# Patient Record
Sex: Male | Born: 1975 | Hispanic: No | Marital: Married | Smoking: Former smoker
Health system: Southern US, Community
[De-identification: ages and names within clinical notes are randomized; demographics above are authoritative.]

## PROBLEM LIST (undated history)

## (undated) DIAGNOSIS — I10 Essential (primary) hypertension: Secondary | ICD-10-CM

## (undated) DIAGNOSIS — A90 Dengue fever [classical dengue]: Secondary | ICD-10-CM

## (undated) HISTORY — DX: Essential (primary) hypertension: I10

## (undated) HISTORY — PX: IMPACTED THIRD MOLAR REMOVAL: SHX1790

---

## 2016-05-22 ENCOUNTER — Emergency Department
Admission: EM | Admit: 2016-05-22 | Discharge: 2016-05-22 | Disposition: A | Discharge status: Home / Self Care | Payer: Medicaid Other | Attending: Emergency Medicine | Admitting: Emergency Medicine

## 2016-05-22 ENCOUNTER — Emergency Department: Payer: Medicaid Other

## 2016-05-22 ENCOUNTER — Encounter: Payer: Self-pay | Admitting: Emergency Medicine

## 2016-05-22 DIAGNOSIS — Z87891 Personal history of nicotine dependence: Secondary | ICD-10-CM | POA: Diagnosis not present

## 2016-05-22 DIAGNOSIS — H538 Other visual disturbances: Secondary | ICD-10-CM | POA: Insufficient documentation

## 2016-05-22 DIAGNOSIS — R791 Abnormal coagulation profile: Secondary | ICD-10-CM | POA: Diagnosis not present

## 2016-05-22 DIAGNOSIS — R42 Dizziness and giddiness: Secondary | ICD-10-CM | POA: Insufficient documentation

## 2016-05-22 HISTORY — DX: Dengue fever (classical dengue): A90

## 2016-05-22 LAB — CBC
HCT: 41.5 % (ref 40.0–52.0)
HEMOGLOBIN: 14.4 g/dL (ref 13.0–18.0)
MCH: 28.8 pg (ref 26.0–34.0)
MCHC: 34.6 g/dL (ref 32.0–36.0)
MCV: 83.2 fL (ref 80.0–100.0)
Platelets: 231 10*3/uL (ref 150–440)
RBC: 4.99 MIL/uL (ref 4.40–5.90)
RDW: 13.5 % (ref 11.5–14.5)
WBC: 4.9 10*3/uL (ref 3.8–10.6)

## 2016-05-22 LAB — PROTIME-INR
INR: 0.96
Prothrombin Time: 12.8 seconds (ref 11.4–15.2)

## 2016-05-22 LAB — DIFFERENTIAL
BASOS ABS: 0.1 10*3/uL (ref 0–0.1)
Basophils Relative: 1 %
Eosinophils Absolute: 0.1 10*3/uL (ref 0–0.7)
Eosinophils Relative: 3 %
LYMPHS ABS: 1.9 10*3/uL (ref 1.0–3.6)
LYMPHS PCT: 39 %
Monocytes Absolute: 0.4 10*3/uL (ref 0.2–1.0)
Monocytes Relative: 9 %
NEUTROS PCT: 48 %
Neutro Abs: 2.4 10*3/uL (ref 1.4–6.5)

## 2016-05-22 LAB — COMPREHENSIVE METABOLIC PANEL
ALK PHOS: 87 U/L (ref 38–126)
ALT: 71 U/L — AB (ref 17–63)
AST: 53 U/L — AB (ref 15–41)
Albumin: 4.3 g/dL (ref 3.5–5.0)
Anion gap: 8 (ref 5–15)
BILIRUBIN TOTAL: 1.3 mg/dL — AB (ref 0.3–1.2)
BUN: 10 mg/dL (ref 6–20)
CALCIUM: 9.3 mg/dL (ref 8.9–10.3)
CO2: 24 mmol/L (ref 22–32)
Chloride: 107 mmol/L (ref 101–111)
Creatinine, Ser: 0.87 mg/dL (ref 0.61–1.24)
GFR calc Af Amer: >60 mL/min (ref >60)
GFR calc non Af Amer: >60 mL/min (ref >60)
GLUCOSE: 131 mg/dL — AB (ref 65–99)
Potassium: 3.7 mmol/L (ref 3.5–5.1)
Sodium: 139 mmol/L (ref 135–145)
TOTAL PROTEIN: 7.7 g/dL (ref 6.5–8.1)

## 2016-05-22 LAB — TROPONIN I: Troponin I: <0.03 ng/mL (ref <0.03)

## 2016-05-22 LAB — APTT: APTT: 31 s (ref 24–36)

## 2016-05-22 MED ORDER — MECLIZINE HCL 25 MG PO TABS
25.0000 mg | ORAL_TABLET | Freq: Once | ORAL | Status: AC
  Administered 2016-05-22: 25 mg via ORAL
  Filled 2016-05-22: qty 1

## 2016-05-22 MED ORDER — MECLIZINE HCL 25 MG PO TABS: 25.0000 mg | ORAL_TABLET | Freq: Three times a day (TID) | ORAL | 0 refills | Status: DC | PRN

## 2016-05-22 NOTE — ED Notes (Addendum)
Pt states vision in L eye is better, more clear. States he can see this Charity fundraiserN.   Pr provided apple juice.   Dr. Lenard LancePaduchowski at bedside.

## 2016-05-22 NOTE — ED Notes (Signed)
Unable to perform neuro check at this time d/t patient being in MRI.

## 2016-05-22 NOTE — ED Notes (Signed)
Pt stated he felt ok to walk to lobby to wait for family. Pt ambulated with steady gait to lobby.

## 2016-05-22 NOTE — ED Provider Notes (Signed)
[facility] Emergency Department Provider Note  Time seen: 8:47 AM  I have reviewed the triage vital signs and the nursing notes.   HISTORY  Chief Complaint Code Stroke    HPI Robert Smith is a 41 y.o. male with no past medical history who presents the emergency department with sudden onset of blurred vision from the left eye along with dizziness. According to the patient he was driving to work and at 8:46 this morning acutely became dizzy as if the room was spinning around him. He also states some blurred vision in the left eye. Denies any headache, confusion or slurred speech. Denies any weakness or numbness of any arm or leg. Denies any recent fever or cough congestion and patient states the vision has largely returned to normal but he continues to feel dizzy which she describes as a room spinning sensation. No history of vertigo in the past.  No past medical history on file.  There are no active problems to display for this patient.   No past surgical history on file.  Prior to Admission medications   Not on File    Allergies not on file  No family history on file.  Social History Social History  Substance Use Topics  . Smoking status: Not on file  . Smokeless tobacco: Not on file  . Alcohol use Not on file    Review of Systems Constitutional: Negative for fever. Cardiovascular: Negative for chest pain. Respiratory: Negative for shortness of breath. Gastrointestinal: Negative for abdominal pain Neurological: Negative for headache. Denies focal weakness or numbness. 10-point ROS otherwise negative.  ____________________________________________   PHYSICAL EXAM:  VITAL SIGNS: ED Triage Vitals  Enc Vitals Group     BP 05/22/16 0833 (!) 149/97     Pulse Rate 05/22/16 0833 85     Resp 05/22/16 0833 18     Temp 05/22/16 0833 97.9 F (36.6 C)     Temp Source 05/22/16 0833 Oral     SpO2 05/22/16 0833 96 %     Weight 05/22/16 0834 210 lb  (95.3 kg)     Height 05/22/16 0834 5\' 8"  (1.727 m)     Head Circumference --      Peak Flow --      Pain Score --      Pain Loc --      Pain Edu? --      Excl. in GC? --     Constitutional: Alert and oriented. Well appearing and in no distress. Eyes: Normal exam, 2-3 mm PERRL. ENT   Head: Normocephalic and atraumatic. Normal tympanic membranes.   Mouth/Throat: Mucous membranes are moist. Cardiovascular: Normal rate, regular rhythm. No murmur Respiratory: Normal respiratory effort without tachypnea nor retractions. Breath sounds are clear Gastrointestinal: Soft and nontender. No distention.   Musculoskeletal: Nontender with normal range of motion in all extremities.  Neurologic:  Normal speech and language. No gross focal neurologic deficits. No pronator drift. 5/5 motor in all extremities. No lower extremity drift. Intact finger to nose testing. Cranial nerves intact. Skin:  Skin is warm, dry and intact.  Psychiatric: Mood and affect are normal.   ____________________________________________    EKG  EKG reviewed and interpreted by the social is normal sinus rhythm at 83 bpm. Narrow QRS, normal axis, normal intervals, no ST changes. Normal EKG.  ____________________________________________    RADIOLOGY  CT head negative  ____________________________________________   INITIAL IMPRESSION / ASSESSMENT AND PLAN / ED COURSE  Pertinent labs & imaging results  that were available during my care of the patient were reviewed by me and considered in my medical decision making (see chart for details).  The patient presents the emergency department with sudden onset of dizziness and blurred vision from the left eye occurring at 7:30 this morning. Describes the dizziness as a room spinning sensation which continues currently. Patient's exam is normal, no deficits identified. Intact cerebellar testing. Patient has an aspirin allergy. CT head is negative. Code stroke protocols were  initiated by triage. Neurology is currently speaking to the patient. Overall his symptoms appear to be more suggestive of vertigo than that of CVA.   Patient has been seen by neurology. They state the patient's exam is most consistent with vertigo however given the complaints of blurred vision they believe the patient would benefit from MRI to rule out CVA. If MRI is negative the patient can be safely discharged home. In the meantime we will treat the patient with meclizine and monitor for symptom improvement.  MRI of the brain is normal. Patient's symptoms are improved. We will discharge with meclizine and PCP follow-up. I discussed strict return precautions for any focal weakness numbness, headache, confusion or slurred speech.    FINAL CLINICAL IMPRESSION(S) / ED DIAGNOSES  Dizziness Vertigo   Minna AntisKevin Camron Essman, MD 05/22/16 1137

## 2016-05-22 NOTE — ED Notes (Signed)
Patient states he was driving to work this am and left eye became blurry. States he had to sit down while he was trying to work d/t feeling dizzy "like I couldn't stay still". Patient with continued dizziness.

## 2016-05-22 NOTE — Discharge Instructions (Signed)
You have been seen in the emergency department for dizziness. Your workup including MRI of her brain is normal. [name] take her prescribed meclizine as needed for dizziness, as written. Please follow-up with a primary care physician in the next 2-3 days for recheck/reevaluation. Return to the emergency department for any worsening dizziness, headache, fever, slurred speech, weakness or numbness of any arm or leg.

## 2016-05-22 NOTE — ED Triage Notes (Addendum)
Pt with left eye blurred vision and extreme dizziness 1 hour PTA. No obvious facial droop or weakness, Pt did have difficulty getting in wheelchair.

## 2016-05-22 NOTE — ED Notes (Signed)
Pt returned from MRI via stretcher.

## 2017-02-16 IMAGING — CT CT HEAD CODE STROKE
3 series · 15 of 47 positions shown, 18 images · non-contrast
Comparison: None.

CLINICAL DATA: Code stroke. Blurred vision in the left eye and
extreme dizziness.

EXAM:
CT HEAD WITHOUT CONTRAST
TECHNIQUE: Contiguous axial images were obtained from the base of the skull
through the vertex without intravenous contrast.

[Series 2: head wo · axial · 0.46mm/px · z∈[-101,+34]mm · 9 of 33 slices shown, 12 images]
[im 3/33  brain]
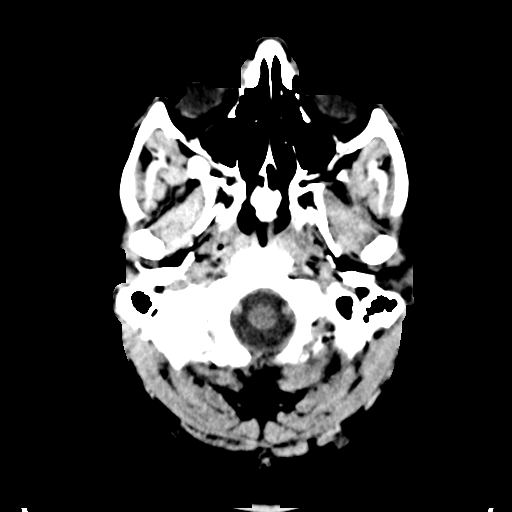
[im 3/33  bone]
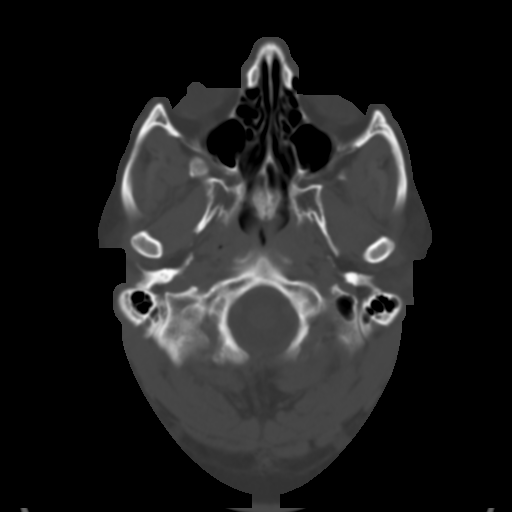
[im 6/33  brain]
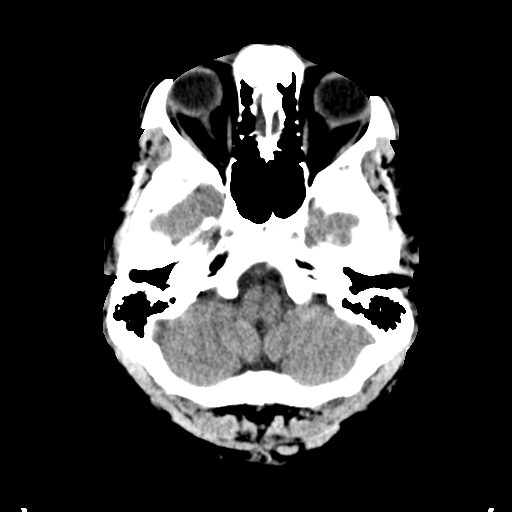
[im 9/33  brain]
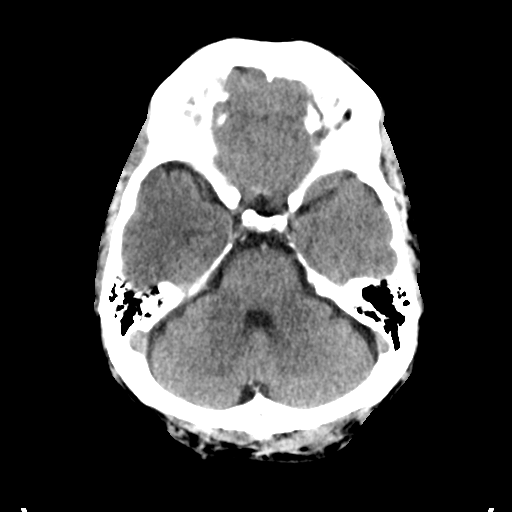
[im 13/33  brain]
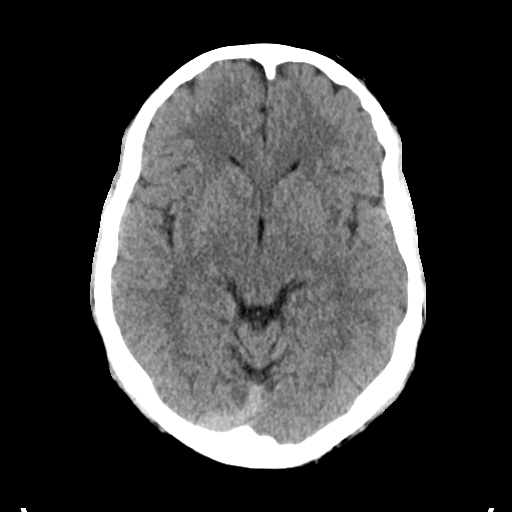
[im 17/33  brain]
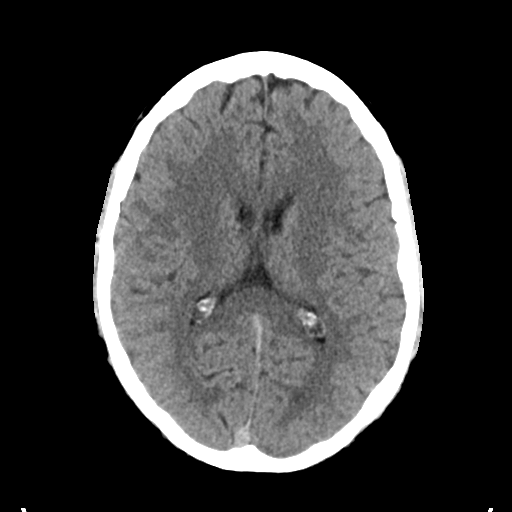
[im 17/33  bone]
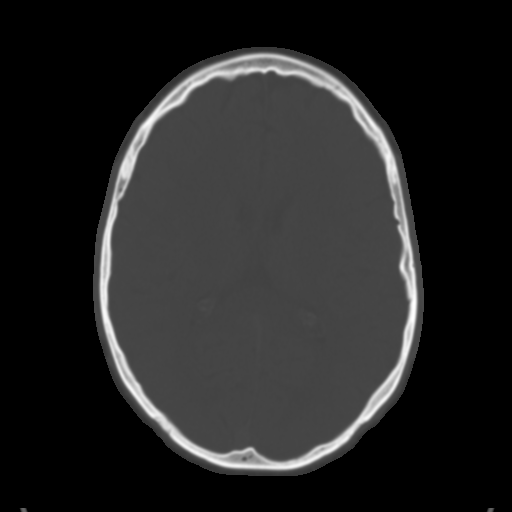
[im 20/33  brain]
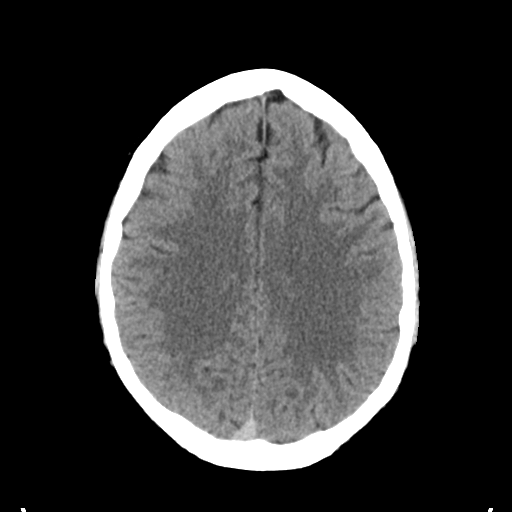
[im 24/33  brain]
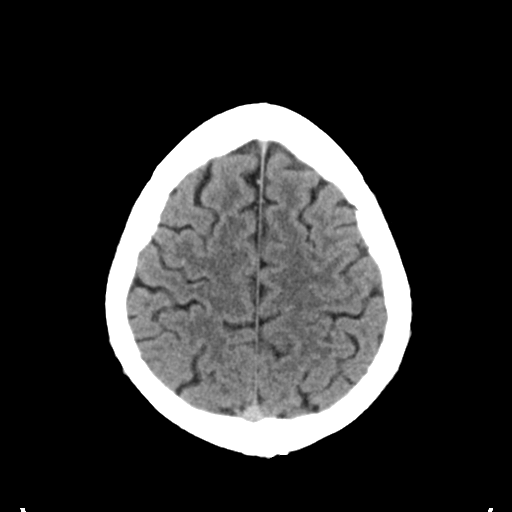
[im 27/33  brain]
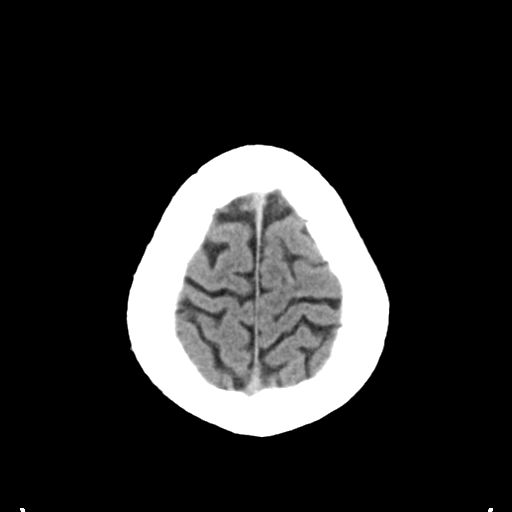
[im 30/33  brain]
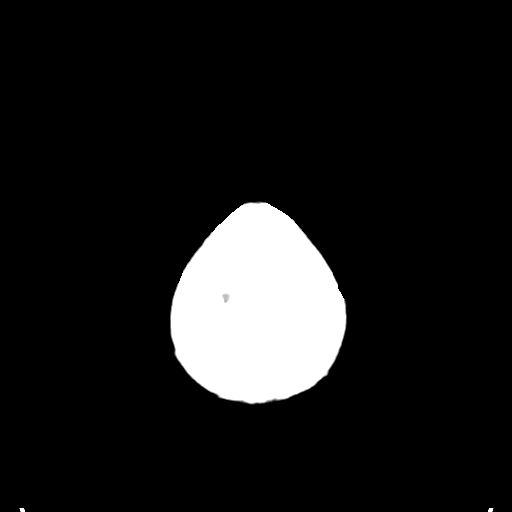
[im 30/33  bone]
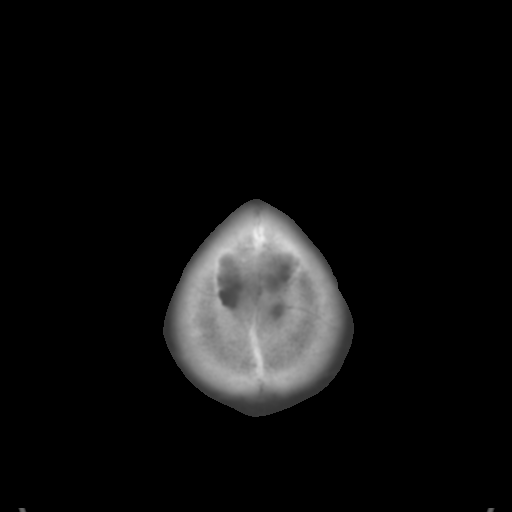

[Series 4: coronal soft tissue · coronal · 0.34mm/px · 3 of 72 slices shown]
[im 24/72  brain]
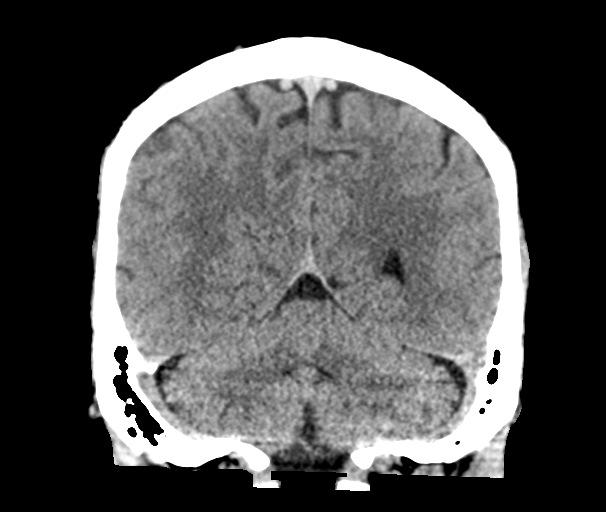
[im 32/72  brain]
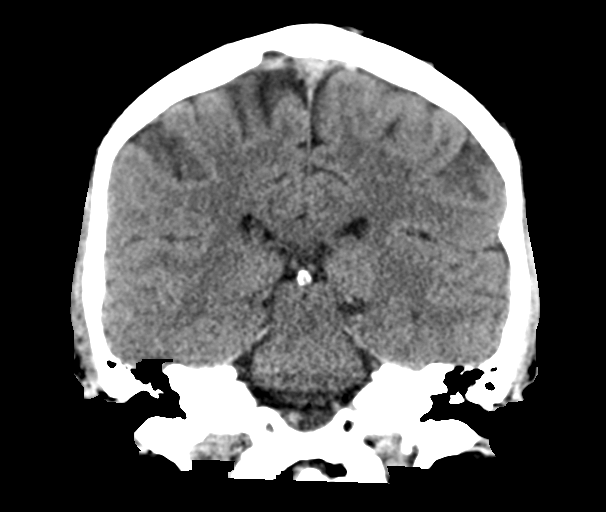
[im 40/72  brain]
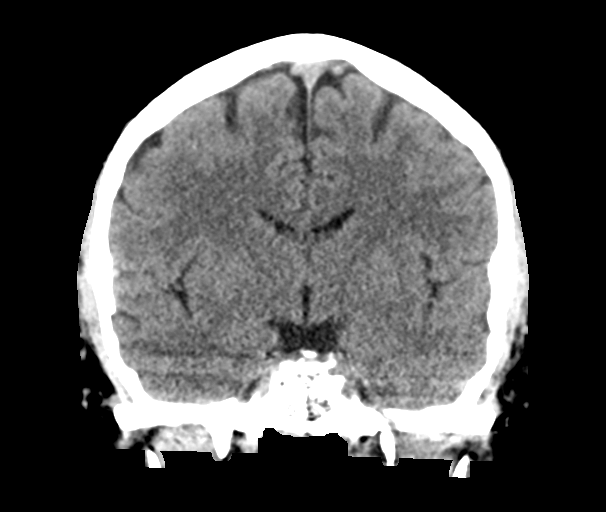

[Series 5: sagittal soft tissue · sagittal · 0.37mm/px · 3 of 59 slices shown]
[im 20/59  brain]
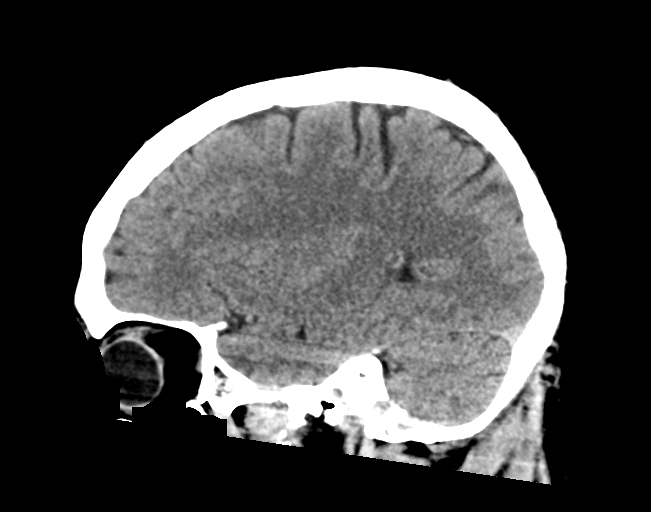
[im 30/59  brain]
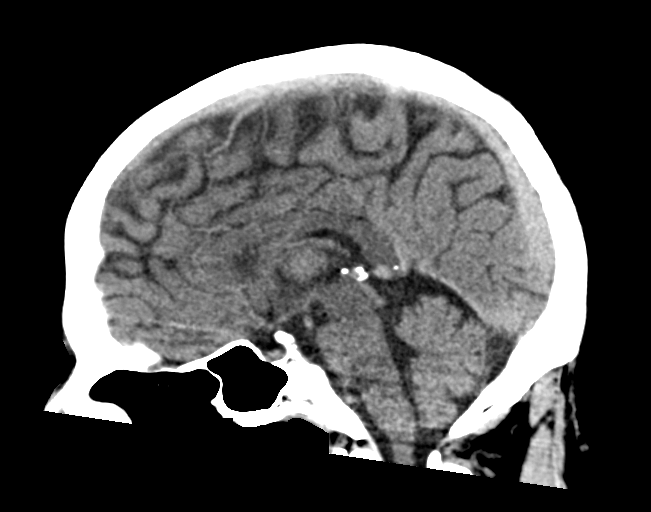
[im 39/59  brain]
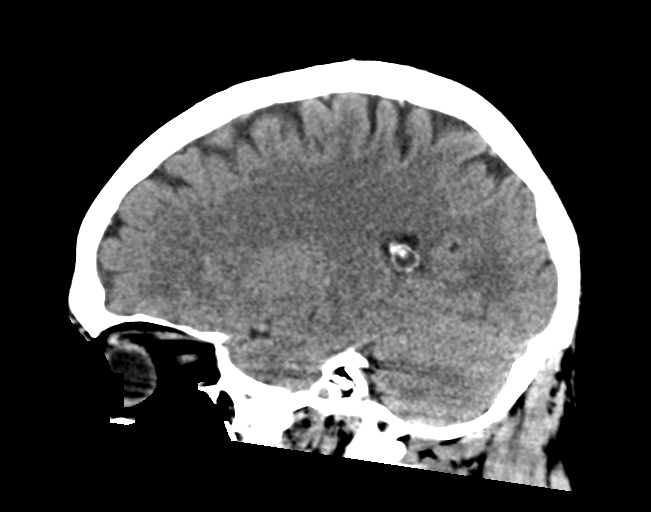

[15 of 47 positions shown; findings below may reference images not displayed]

FINDINGS: Brain: No evidence of acute infarction, hemorrhage, hydrocephalus,
extra-axial collection or mass lesion/mass effect.

Vascular: No hyperdense vessel or unexpected calcification.

Skull: Normal. Negative for fracture or focal lesion.

Sinuses/Orbits: Negative

Other: These results were called by telephone at the time of
interpretation on 05/22/2016 at [DATE] to Dr. Roshan , who
verbally acknowledged these results.

ASPECTS (Alberta Stroke Program Early CT Score)

- Ganglionic level infarction (caudate, lentiform nuclei, internal
capsule, insula, M1-M3 cortex): 7

- Supraganglionic infarction (M4-M6 cortex): 3

Total score (0-10 with 10 being normal): 10
IMPRESSION: Negative head CT. ASPECTS is 10.

## 2017-06-29 ENCOUNTER — Ambulatory Visit: Payer: Self-pay

## 2017-07-06 ENCOUNTER — Ambulatory Visit: Payer: Self-pay

## 2017-07-13 ENCOUNTER — Encounter: Payer: Self-pay | Admitting: Urology

## 2017-07-13 ENCOUNTER — Ambulatory Visit (INDEPENDENT_AMBULATORY_CARE_PROVIDER_SITE_OTHER): Payer: Medicaid Other | Admitting: Urology

## 2017-07-13 DIAGNOSIS — Z3009 Encounter for other general counseling and advice on contraception: Secondary | ICD-10-CM | POA: Diagnosis not present

## 2017-07-13 MED ORDER — DIAZEPAM 10 MG PO TABS: 10.0000 mg | ORAL_TABLET | Freq: Once | ORAL | 0 refills | Status: AC

## 2017-07-13 NOTE — Progress Notes (Signed)
07/13/2017 1:38 PM   Robert Smith 1975/12/29 [id number]  Referring provider: No referring provider defined for this encounter.  Chief Complaint  Patient presents with  . VAS Consult    HPI: The patient is a 42 year old gentleman who presents today to discuss a vasectomy.  He has 2 children and desires no more.  He is currently married.  He has no genitourinary history.  He denies previous genitourinary surgeries.  He denies issues with urination.  He has no history of gross hematuria or nephrolithiasis.   PMH: Past Medical History:  Diagnosis Date  . Dengue fever     Surgical History: History reviewed. No pertinent surgical history.  Home Medications:  Allergies as of 07/13/2017      Reactions   Aspirin Other (See Comments)   "it thins my blood and I don't have much white cells"   Penicillins    Has patient had a PCN reaction causing immediate rash, facial/tongue/throat swelling, SOB or lightheadedness with hypotension: yes Has patient had a PCN reaction causing severe rash involving mucus membranes or skin necrosis: no Has patient had a PCN reaction that required hospitalization no Has patient had a PCN reaction occurring within the last 10 years: no If all of the above answers are "NO", then may proceed with Cephalosporin use. Causes dizziness and weakness       Medication List        Accurate as of 07/13/17  1:38 PM. Always use your most recent med list.          diazepam 10 MG tablet Commonly known as:  VALIUM Take 1 tablet (10 mg total) by mouth once for 1 dose.   meclizine 25 MG tablet Commonly known as:  ANTIVERT Take 1 tablet (25 mg total) by mouth 3 (three) times daily as needed for dizziness.       Allergies:  Allergies  Allergen Reactions  . Aspirin Other (See Comments)    "it thins my blood and I don't have much white cells"  . Penicillins     Has patient had a PCN reaction causing immediate rash, facial/tongue/throat swelling, SOB or  lightheadedness with hypotension: yes Has patient had a PCN reaction causing severe rash involving mucus membranes or skin necrosis: no Has patient had a PCN reaction that required hospitalization no Has patient had a PCN reaction occurring within the last 10 years: no If all of the above answers are "NO", then may proceed with Cephalosporin use.  Causes dizziness and weakness      Family History: Family History  Problem Relation Age of Onset  . Prostate cancer Maternal Grandfather   . Sickle cell trait Son   . Bladder Cancer Neg Hx   . Kidney cancer Neg Hx     Social History:  reports that he has quit smoking. He has never used smokeless tobacco. He reports that he does not drink alcohol or use drugs.  ROS: UROLOGY Frequent Urination?: No Hard to postpone urination?: No Burning/pain with urination?: No Get up at night to urinate?: No Leakage of urine?: No Urine stream starts and stops?: No Trouble starting stream?: No Do you have to strain to urinate?: No Blood in urine?: No Urinary tract infection?: No Sexually transmitted disease?: No Injury to kidneys or bladder?: No Painful intercourse?: No Weak stream?: No Erection problems?: No Penile pain?: No  Gastrointestinal Nausea?: No Vomiting?: No Indigestion/heartburn?: No Diarrhea?: No Constipation?: No  Constitutional Fever: No Night sweats?: No Weight loss?: No Fatigue?: No  Skin Skin rash/lesions?: No Itching?: No  Eyes Blurred vision?: No Double vision?: No  Ears/Nose/Throat Sore throat?: No Sinus problems?: No  Hematologic/Lymphatic Swollen glands?: No Easy bruising?: No  Cardiovascular Leg swelling?: No Chest pain?: No  Respiratory Cough?: No Shortness of breath?: No  Endocrine Excessive thirst?: No  Musculoskeletal Back pain?: No Joint pain?: No  Neurological Headaches?: No Dizziness?: No  Psychologic Depression?: No Anxiety?: No  Physical Exam: There were no vitals  taken for this visit.  Constitutional:  Alert and oriented, No acute distress. HEENT: [place] AT, moist mucus membranes.  Trachea midline, no masses. Cardiovascular: No clubbing, cyanosis, or edema. Respiratory: Normal respiratory effort, no increased work of breathing. GI: Abdomen is soft, nontender, nondistended, no abdominal masses GU: No CVA tenderness.  Normal phallus.  Left vas easily palpable.  Right vas a little more difficult to palpate. Skin: No rashes, bruises or suspicious lesions. Lymph: No cervical or inguinal adenopathy. Neurologic: Grossly intact, no focal deficits, moving all 4 extremities. Psychiatric: Normal mood and affect.  Laboratory Data: Lab Results  Component Value Date   WBC 4.9 05/22/2016   HGB 14.4 05/22/2016   HCT 41.5 05/22/2016   MCV 83.2 05/22/2016   PLT 231 05/22/2016    Lab Results  Component Value Date   CREATININE 0.87 05/22/2016    No results found for: PSA  No results found for: TESTOSTERONE  No results found for: HGBA1C  Urinalysis No results found for: COLORURINE, APPEARANCEUR, LABSPEC, PHURINE, GLUCOSEU, HGBUR, BILIRUBINUR, KETONESUR, PROTEINUR, UROBILINOGEN, NITRITE, LEUKOCYTESUR   Assessment & Plan:    Today, we discussed what the vas deferens is, where it is located, and its function. We reviewed the procedure for vasectomy, it's risks, benefits, alternatives, and likelihood of achieving his goals. We discussed in detail the procedure, complications, and recovery as well as the need for clearance prior to unprotected intercourse. We discussed that vasectomy does not protect against sexually transmitted diseases. We discussed that this procedure does not result in immediate sterility and that they would need to use other forms of birth control until he has been cleared with negative postvasectomy semen analyses. I explained that the procedure is considered to be permanent and that attempts at reversal have varying degrees of success. These  options include vasectomy reversal, sperm retrieval, and in vitro fertilization; these can be very expensive. We discussed the chance of postvasectomy pain syndrome which occurs in less than 5% of patients. I explained to the patient that there is no treatment to resolve this chronic pain, and that if it developed I would not be able to help resolve the issue, but that surgery is generally not needed for correction. I explained there have even been reports of systemic like illness associated with this chronic pain, and that there was no good cure. I explained that vasectomy it is not a 100% reliable form of birth control, and the risk of pregnancy after vasectomy is approximately 1 in 2000 men who had a negative postvasectomy semen analysis or rare non-motile sperm. I explained that repeat vasectomy was necessary in less than 1% of vasectomy procedures when employing the type of technique that I use. I explained that he should refrain from ejaculation for approximately one week following vasectomy. I explained that there are other options for birth control which are permanent and non-permanent; we discussed these. I explained the rates of surgical complications, such as symptomatic hematoma or infection, are low (1-2%) and vary with the surgeon's experience and criteria used to diagnose  the complication.  The patient had the opportunity to ask questions to his stated satisfaction. He voiced understanding of the above factors and stated that he has read all the information provided to him and the packets and informed consent  1. Family planning -vasectomy  Return for vasectomy.  Hildred Laser, MD  [facility] [address] [place], [place] [address] [phone]

## 2017-08-17 ENCOUNTER — Encounter: Payer: Self-pay | Admitting: Urology

## 2017-08-17 ENCOUNTER — Ambulatory Visit (INDEPENDENT_AMBULATORY_CARE_PROVIDER_SITE_OTHER): Payer: Medicaid Other | Admitting: Urology

## 2017-08-17 VITALS — BP 130/84 | HR 84 | Resp 16 | Ht 69.0 in | Wt 209.4 lb

## 2017-08-17 DIAGNOSIS — Z302 Encounter for sterilization: Secondary | ICD-10-CM | POA: Diagnosis not present

## 2017-08-17 HISTORY — PX: VASECTOMY: SHX75

## 2017-08-17 MED ORDER — HYDROCODONE-ACETAMINOPHEN 5-325 MG PO TABS: 1.0000 | ORAL_TABLET | ORAL | 0 refills | Status: DC | PRN

## 2017-08-17 NOTE — Progress Notes (Signed)
Vasectomy Procedure Note  Indications: The patient is a 42 y.o. male who presents today for elective sterilization.  He has been consented for the procedure.  He is aware of the risks and benefits.  He had no additional questions.  He agrees to proceed.  He denies any other significant change since his last visit.  Pre-operative Diagnosis: Elective sterilization  Post-operative Diagnosis: Elective sterilization  Premedication: Valium 10 mg po  Surgeon: Lorin PicketScott C. Nicoli Nardozzi, M.D  Description: The patient was prepped and draped in the standard fashion.  The right vas deferens was identified and brought superiorly to the anterior scrotal skin.  The skin and vas was then anesthetized utilizing 4 ml 1% lidocaine.  A small stab incision was made and spread with the vas dissector.  The vas was grasped utilizing the vas clamp and elevated out of the incision.  The vas was dissected free from surrounding tissue and vessels and an ~1 cm segment was excised.  The vas lumens were cauterized utilizing electrocautery.  The distal segment was buried in the surrounding sheath with a 3-0 chromic suture.  No significant bleeding was observed.  The vas ends were then dropped back into the hemiscrotum.  The skin was closed with hemostatic pressure.  An identical procedure was performed on the contralateral side.  Clean dry gauze was applied to the incision sites.  The patient tolerated the procedure well.  Complications:None  Recommendations: 1.  No lifting greater than 10 pounds or strenuousactivity for 1 week. 2.  Scrotal support for 1 week. 3.  Shower only for 1 week; may shower in the morning 4.  May resume intercourse in one week if no significant discomfort.  Continue alternate contraception for 12 weeks.  5.  Call for significant pain, swelling, redness, drainage or fever greater than 100.5. 6.  Rx hydrocodone/APAP 5/325 1-2 every 6 hours as needed for pain. 7.  Follow-up semen analyses 6 and 12 weeks.

## 2017-08-21 ENCOUNTER — Encounter: Payer: Self-pay | Admitting: Urology

## 2017-08-22 ENCOUNTER — Encounter: Payer: Self-pay | Admitting: Radiology

## 2017-09-20 ENCOUNTER — Ambulatory Visit
Admission: RE | Admit: 2017-09-20 | Discharge: 2017-09-20 | Disposition: A | Discharge status: Home / Self Care | Payer: Managed Care, Other (non HMO) | Source: Ambulatory Visit | Attending: Family Medicine | Admitting: Family Medicine

## 2017-09-20 ENCOUNTER — Ambulatory Visit: Payer: Medicaid Other | Admitting: Family Medicine

## 2017-09-20 VITALS — BP 136/88 | HR 94 | Temp 98.0°F | Resp 20

## 2017-09-20 DIAGNOSIS — R300 Dysuria: Secondary | ICD-10-CM | POA: Diagnosis present

## 2017-09-20 DIAGNOSIS — J029 Acute pharyngitis, unspecified: Secondary | ICD-10-CM

## 2017-09-20 DIAGNOSIS — R1030 Lower abdominal pain, unspecified: Secondary | ICD-10-CM

## 2017-09-20 DIAGNOSIS — M545 Low back pain: Secondary | ICD-10-CM

## 2017-09-20 DIAGNOSIS — M62830 Muscle spasm of back: Secondary | ICD-10-CM

## 2017-09-20 DIAGNOSIS — J069 Acute upper respiratory infection, unspecified: Secondary | ICD-10-CM

## 2017-09-20 DIAGNOSIS — R350 Frequency of micturition: Secondary | ICD-10-CM | POA: Diagnosis not present

## 2017-09-20 LAB — POCT URINALYSIS DIPSTICK
BILIRUBIN UA: NEGATIVE
GLUCOSE UA: NEGATIVE
KETONES UA: NEGATIVE
Leukocytes, UA: NEGATIVE
Nitrite, UA: NEGATIVE
Protein, UA: NEGATIVE
RBC UA: NEGATIVE
SPEC GRAV UA: 1.02 (ref 1.010–1.025)
Urobilinogen, UA: 0.2 E.U./dL
pH, UA: 5.5 (ref 5.0–8.0)

## 2017-09-20 LAB — POCT RAPID STREP A (OFFICE): Rapid Strep A Screen: NEGATIVE

## 2017-09-20 MED ORDER — CYCLOBENZAPRINE HCL 5 MG PO TABS: 5.0000 mg | ORAL_TABLET | Freq: Every day | ORAL | 0 refills | Status: DC

## 2017-09-20 NOTE — Progress Notes (Addendum)
Subjective: multiple complaints     Robert Smith is a 42 y.o. male who presents for evaluation of sore throat, productive cough, low back pain, dysuria, fever, urinary frequency, and chills.  Patient unsure of duration of fever and chills.  Reports fever at home was "110" two days ago.  Patient reports sore throat and a productive cough with green sputum for 2 days.  Denies any other upper respiratory symptoms.  Denies shortness of breath, wheezing, or chest or back pain.  Reports his symptoms are mild and improving. Denies rash, ear pain, difficulty swallowing, confusion, nasal congestion, purulent nasal discharge, anosmia/hyposmia, dental pain, facial pressure/unilateral facial pain, pain exacerbated by bending over, headache, body aches, fatigue, severe symptoms, or initial improvement and then worsening of symptoms.  Patient had a vasectomy on 08/17/2017 and reports that approximately 1 week later he began to notice urinary frequency, dysuria, hematuria, nausea, loss of appetite, and bilateral lower back pain.  Patient has not contacted his urologist regarding this.  Patient denies any previous history of UTI, pyelonephritis, nephrolithiasis, prostate issues, renal insufficiency, or any other relevant history.  Patient denies any personal history of prostate cancer and his only family history of prostate cancer was his paternal grandfather, who was diagnosed at the age of 3. Denies vomiting, diarrhea, abdominal pain, arthralagias, constipation, headache, hematochezia, melena, myalgias or sweats.   Patient denies any risk factors or concern for STDs.  Patient declined testing for STDs.  Patient denies any genital rashes, lesions, or penile discharge.  Denies any penile or scrotal swelling, pain, erythema, warmth to the touch, or any abnormalities.  Patient reports the back pain is worse with flexion and side-to-side motion of his back.  Patient reports he has been lifting weights more recently but denies any  injury which he attributes to the onset of his back pain. The pain is located across the lower back and does not radiate. The pain is described as soreness and occurs all day and night.  He has no other symptoms associated with the back pain. Previous history of symptoms: never. History of cancer: negative. The patient has no "red flag" history indicative of complicated back pain.  Patient denies previous injury, trauma or surgeries to his  back.  Denies saddle anesthesia, urinary/bowel retention, or any neurologic symptoms.   Treatment to date: stretching and exercises.  Medical history: Patient denies any other than his recent vasectomy.   Review of Systems Pertinent items noted in HPI and remainder of comprehensive ROS otherwise negative.     Objective:   Physical Exam General: Awake, alert, and oriented. No acute distress. Well developed, hydrated and nourished. Appears stated age. Nontoxic appearance.  HEENT: No PND noted.  Mild erythema to posterior oropharynx.  No edema or exudates of pharynx or tonsils. No erythema or bulging of TM.  No erythema/edema to nasal mucosa. Sinuses nontender. Supple neck without adenopathy. Cardiac: Heart rate and rhythm are normal. No murmurs, gallops, or rubs are auscultated. S1 and S2 are heard and are of normal intensity.  Respiratory: No signs of respiratory distress. Lungs clear. No tachypnea. Able to speak in full sentences without dyspnea. Nonlabored respirations.  Skin: Skin is warm, dry and intact. Appropriate color for ethnicity. No cyanosis noted.  Normal turgor.  No lesions or rashes. Back: Full range of motion.  Patient endorses pain with flexion and side-to-side motion of his low back.  Lumbar paraspinal muscle tenderness bilaterally with muscle spasm more pronounced to left lower back.  Patient is most tender  in the left lumbar paraspinal muscles.  No midline tenderness or deformity.  Normal curvature and alignment. Normal reflexes, gait, strength  and negative straight-leg raise.  No CVA tenderness noted. Neurological: normal DTRs, muscle strength and reflexes. Abdomen:  Soft, no masses, no organomegaly, normal bowel sounds.  Generalized bilateral lower abdominal tenderness.  No guarding. No rebound tenderness. Negative Rovsing sign.  Negative psoas and obturator sign.  Patient reports that the pain is equal on both sides of his abdomen.  No abdominal rigidity.  Tenderness is not localized to McBurney's point.  Negative Markel sign. Extremities: extremities normal, atraumatic, no cyanosis or edema Pulses: 2+ and symmetric  Diagnostic Results:  Rapid strep swab: Negative Urine dipstick: Negative for all components.  EXAM: RENAL / URINARY TRACT ULTRASOUND COMPLETE  COMPARISON:  None.  FINDINGS: Right Kidney:  Length: 12.0 cm. Echogenicity within normal limits. No mass or hydronephrosis visualized.  Left Kidney:  Length: 11.5 cm. Echogenicity within normal limits. No mass or hydronephrosis visualized. 1 cm lower pole cyst.  Bladder:  Appears normal for degree of bladder distention. Normal ureteral jets.  IMPRESSION: Normal urinary tract ultrasound. No sonographic evidence of obstruction or advanced infection.  These results will be called to the ordering clinician or representative by the Radiologist Assistant, and communication documented in the PACS or zVision Dashboard.   Electronically Signed   By: Paulina Fusi M.D.   On: 09/20/2017 17:25   Assessment:   Viral upper respiratory illness  Low back pain  Abdominal tenderness, non specific  Dysuria  Plan:   1. URI  Discussed diagnosis and treatment of URI. Suggested symptomatic OTC remedies. Recommended rest and fluids. Advised patient to monitor for fever at home and report if this recurs to our clinic and to his urologist.  2.  Low back pain Proper lifting, bending technique discussed. Stretching exercises discussed. Regular aerobic and  trunk strengthening exercises discussed. Short (2-4 day) period of relative rest recommended until acute symptoms improve. Heat to affected area as needed for local pain relief. Provided patient with ThermaCare heat wrap and recommended applying heat 2-3 times a day. Provided patient with a prescription for Flexeril to use as needed at night.  Discussed side/adverse effects and advised him against driving, operating machinery, or combining with other CNS depressants such as alcohol.  3. Abdominal tenderness and urinary symptoms  CBC, CMP, PSA, and urine culture pending. Advised patient to call his urologist office immediately upon leaving to schedule a follow-up appointment for as soon as possible for further assessment of his symptoms. Attempting to call the patient multiple times to discuss his [place] and lab results but have been unable to reach him by phone.    The diagnosis was discussed with the patient and evaluation and treatment plans outlined. Further follow-up plans will be based on outcome of lab/imaging studies; see orders.  Discussed red flag symptoms and circumstances with which to seek medical care.   New Prescriptions   CYCLOBENZAPRINE (FLEXERIL) 5 MG TABLET    Take 1 tablet (5 mg total) by mouth at bedtime.    09/27/2017: Patient finally called the clinic back to discuss his recent results.  Discussed ultrasound results and advised patient follow-up with his urologist regarding these at his apt on 09/28/17.  Discussed his lab results and his elevated AST and ALT.  Patient reports infrequent Tylenol and alcohol use.  Advised patient that he needs to establish care with a new primary care provider within the next 2 weeks and follow-up with  his primary care provider regarding this for further evaluation.  Provided patient with local resources for primary care and encouraged him to call today to make an appointment.  Patient reports tolerating the Flexeril well and that his symptoms have  improved significantly.  Denies any new symptoms.  Patient has a appointment with his urologist scheduled for 09/28/17.  Red flag symptoms and indications to seek medical care discussed.

## 2017-09-21 LAB — CBC WITH DIFFERENTIAL/PLATELET
Basophils Absolute: 0 10*3/uL (ref 0.0–0.2)
Basos: 1 %
EOS (ABSOLUTE): 0.2 10*3/uL (ref 0.0–0.4)
EOS: 2 %
HEMATOCRIT: 41.9 % (ref 37.5–51.0)
HEMOGLOBIN: 14.2 g/dL (ref 13.0–17.7)
Immature Grans (Abs): 0 10*3/uL (ref 0.0–0.1)
Immature Granulocytes: 1 %
LYMPHS ABS: 2.4 10*3/uL (ref 0.7–3.1)
Lymphs: 37 %
MCH: 28.4 pg (ref 26.6–33.0)
MCHC: 33.9 g/dL (ref 31.5–35.7)
MCV: 84 fL (ref 79–97)
MONOCYTES: 8 %
Monocytes Absolute: 0.6 10*3/uL (ref 0.1–0.9)
NEUTROS ABS: 3.4 10*3/uL (ref 1.4–7.0)
Neutrophils: 51 %
Platelets: 271 10*3/uL (ref 150–450)
RBC: 5 x10E6/uL (ref 4.14–5.80)
RDW: 14.1 % (ref 12.3–15.4)
WBC: 6.6 10*3/uL (ref 3.4–10.8)

## 2017-09-21 LAB — COMPREHENSIVE METABOLIC PANEL
ALBUMIN: 4.6 g/dL (ref 3.5–5.5)
ALT: 80 IU/L — ABNORMAL HIGH (ref 0–44)
AST: 46 IU/L — ABNORMAL HIGH (ref 0–40)
Albumin/Globulin Ratio: 1.4 (ref 1.2–2.2)
Alkaline Phosphatase: 101 IU/L (ref 39–117)
BILIRUBIN TOTAL: 0.8 mg/dL (ref 0.0–1.2)
BUN / CREAT RATIO: 13 (ref 9–20)
BUN: 11 mg/dL (ref 6–24)
CO2: 24 mmol/L (ref 20–29)
CREATININE: 0.86 mg/dL (ref 0.76–1.27)
Calcium: 9.8 mg/dL (ref 8.7–10.2)
Chloride: 103 mmol/L (ref 96–106)
GFR calc non Af Amer: 108 mL/min/{1.73_m2} (ref >59)
GFR, EST AFRICAN AMERICAN: 125 mL/min/{1.73_m2} (ref >59)
GLOBULIN, TOTAL: 3.2 g/dL (ref 1.5–4.5)
GLUCOSE: 114 mg/dL — AB (ref 65–99)
Potassium: 4.3 mmol/L (ref 3.5–5.2)
Sodium: 142 mmol/L (ref 134–144)
TOTAL PROTEIN: 7.8 g/dL (ref 6.0–8.5)

## 2017-09-21 LAB — PSA: Prostate Specific Ag, Serum: 0.5 ng/mL (ref 0.0–4.0)

## 2017-09-21 NOTE — Progress Notes (Signed)
Attempted to call patient multiple times without any success to discuss his lab and [place] results. Will continue to try calling the patient.

## 2017-09-22 LAB — URINE CULTURE: ORGANISM ID, BACTERIA: NO GROWTH

## 2017-09-25 NOTE — Progress Notes (Signed)
Carollee HerterShannon, Will you mail this patient copies of his lab and ultrasound results?  Please include a note to call our clinic to discuss these.  I been trying to reach him by phone since last week and have been unsuccessful.

## 2017-09-25 NOTE — Progress Notes (Signed)
Attempted to call the patient multiple times additionally over the last few days without any success and left a voicemail for the patient to call the clinic back to discuss his ultrasound and lab results.

## 2017-09-26 NOTE — Progress Notes (Signed)
I tried calling him multiple times yesterday after four without any success again. Will you see if you can get in touch with him today and transfer the call to me if you do? If not, send him the labs in the mail with a note to call us. Thank you!

## 2017-09-28 ENCOUNTER — Other Ambulatory Visit: Payer: Medicaid Other

## 2017-09-28 ENCOUNTER — Other Ambulatory Visit: Payer: Self-pay | Admitting: Family Medicine

## 2017-09-28 DIAGNOSIS — Z9852 Vasectomy status: Secondary | ICD-10-CM

## 2017-09-29 LAB — POST-VAS SPERM EVALUATION,QUAL: SEMEN VOLUME: 3.6 mL

## 2017-10-01 ENCOUNTER — Telehealth: Payer: Self-pay

## 2017-10-01 NOTE — Telephone Encounter (Signed)
-----   Message from Riki AltesScott C Stoioff, MD sent at 10/01/2017  8:39 AM EDT ----- 6-week post vasectomy specimen show sperm present.  Continue alternate contraception until a 12-week specimen is examined.

## 2017-10-01 NOTE — Telephone Encounter (Signed)
Pt informed

## 2017-10-16 NOTE — Progress Notes (Signed)
10/17/2017 2:32 PM   Robert Smith July 20, 1975 [id number]  Referring provider: No referring provider defined for this encounter.  No chief complaint on file.   HPI: Patient is a 42 -year-old Hispanic male who presents today as a referral from Frances MaywoodKatherine M D'Jernes, FNP for recheck for urinary frequency.    One month ago, he woke up feeling sick.  He had fevers of 101 with all over body aches.  He also had a sore throat.  He had urinary frequency and nocturia as well.  RUS in 08/2017 was negative.  This lasted for a couple of days.    He does not have a prior history of recurrent urinary tract infections, nephrolithiasis, trauma to the genitourinary tract, BPH or malignancies of the genitourinary tract.   His paternal grandfather had prostate cancer.  He does not have a family medical history of nephrolithiasis or hematuria.   Today, he is not having symptoms of frequent urination, urgency, dysuria, nocturia, incontinence, hesitancy, intermittency, straining to urinate or a weak urinary stream.  His UA today is negative.    Patient denies any gross hematuria, dysuria or suprapubic/flank pain.  Patient denies any fevers, chills, nausea or vomiting.    PMH: Past Medical History:  Diagnosis Date  . Dengue fever     Surgical History: History reviewed. No pertinent surgical history.  Home Medications:  Allergies as of 10/17/2017      Reactions   Aspirin Other (See Comments)   "it thins my blood and I don't have much white cells"   Penicillins    Has patient had a PCN reaction causing immediate rash, facial/tongue/throat swelling, SOB or lightheadedness with hypotension: yes Has patient had a PCN reaction causing severe rash involving mucus membranes or skin necrosis: no Has patient had a PCN reaction that required hospitalization no Has patient had a PCN reaction occurring within the last 10 years: no If all of the above answers are "NO", then may proceed with Cephalosporin  use. Causes dizziness and weakness       Medication List    as of 10/17/2017  2:32 PM   You have not been prescribed any medications.     Allergies:  Allergies  Allergen Reactions  . Aspirin Other (See Comments)    "it thins my blood and I don't have much white cells"  . Penicillins     Has patient had a PCN reaction causing immediate rash, facial/tongue/throat swelling, SOB or lightheadedness with hypotension: yes Has patient had a PCN reaction causing severe rash involving mucus membranes or skin necrosis: no Has patient had a PCN reaction that required hospitalization no Has patient had a PCN reaction occurring within the last 10 years: no If all of the above answers are "NO", then may proceed with Cephalosporin use.  Causes dizziness and weakness      Family History: Family History  Problem Relation Age of Onset  . Prostate cancer Maternal Grandfather   . Sickle cell trait Son   . Bladder Cancer Neg Hx   . Kidney cancer Neg Hx     Social History:  reports that he has quit smoking. He has never used smokeless tobacco. He reports that he does not drink alcohol or use drugs.  ROS: UROLOGY Frequent Urination?: Yes Hard to postpone urination?: No Burning/pain with urination?: No Get up at night to urinate?: Yes Leakage of urine?: No Urine stream starts and stops?: No Trouble starting stream?: No Do you have to strain to urinate?: No  Blood in urine?: Yes Urinary tract infection?: No Sexually transmitted disease?: No Injury to kidneys or bladder?: No Painful intercourse?: No Weak stream?: No Erection problems?: No Penile pain?: No  Gastrointestinal Nausea?: No Vomiting?: No Indigestion/heartburn?: No Diarrhea?: No Constipation?: No  Constitutional Fever: Yes Night sweats?: No Weight loss?: No Fatigue?: Yes  Skin Skin rash/lesions?: Yes Itching?: No  Eyes Blurred vision?: Yes Double vision?: No  Ears/Nose/Throat Sore throat?: No Sinus  problems?: No  Hematologic/Lymphatic Swollen glands?: No Easy bruising?: No  Cardiovascular Leg swelling?: No Chest pain?: No  Respiratory Cough?: No Shortness of breath?: No  Endocrine Excessive thirst?: Yes  Musculoskeletal Back pain?: Yes Joint pain?: No  Neurological Headaches?: No Dizziness?: Yes  Psychologic Depression?: No Anxiety?: No  Physical Exam: BP 138/84 (BP Location: Right Arm, Patient Position: Sitting, Cuff Size: Normal)   Pulse 76   Ht 5\' 9"  (1.753 m)   Wt 211 lb 9.6 oz (96 kg)   BMI 31.25 kg/m   Constitutional:  Well nourished. Alert and oriented, No acute distress. HEENT: [place] AT, moist mucus membranes.  Trachea midline, no masses. Cardiovascular: No clubbing, cyanosis, or edema. Respiratory: Normal respiratory effort, no increased work of breathing. GI: Abdomen is soft, non tender, non distended, no abdominal masses. Liver and spleen not palpable.  No hernias appreciated.  Stool sample for occult testing is not indicated.   GU: No CVA tenderness.  No bladder fullness or masses.  Patient with uncircumcised phallus.  Foreskin easily retracted.  Urethral meatus is patent.  No penile discharge. No penile lesions or rashes. Scrotum without lesions, cysts, rashes and/or edema.  Testicles are located scrotally bilaterally. No masses are appreciated in the testicles. Left and right epididymis are normal. Rectal: Patient with  normal sphincter tone. Anus and perineum without scarring or rashes. No rectal masses are appreciated. Prostate is approximately 45 grams, no nodules are appreciated. Seminal vesicles are normal. Skin: No rashes, bruises or suspicious lesions. Lymph: No cervical or inguinal adenopathy. Neurologic: Grossly intact, no focal deficits, moving all 4 extremities. Psychiatric: Normal mood and affect.  Laboratory Data: PSA history   0.5 ng/mL on 09/20/2017   Lab Results  Component Value Date   WBC 6.6 09/20/2017   HGB 14.2 09/20/2017    HCT 41.9 09/20/2017   MCV 84 09/20/2017   PLT 271 09/20/2017    Lab Results  Component Value Date   CREATININE 0.86 09/20/2017    No results found for: PSA  No results found for: TESTOSTERONE  No results found for: HGBA1C  No results found for: TSH  No results found for: CHOL, HDL, CHOLHDL, VLDL, LDLCALC  Lab Results  Component Value Date   AST 46 (H) 09/20/2017   Lab Results  Component Value Date   ALT 80 (H) 09/20/2017   No components found for: ALKALINEPHOPHATASE No components found for: BILIRUBINTOTAL  No results found for: ESTRADIOL   Urinalysis Negative.  See Epic.    Pertinent Imaging: CLINICAL DATA:  Bilateral low back pain and hematuria. Urinary frequency. Recent vasectomy.  EXAM: RENAL / URINARY TRACT ULTRASOUND COMPLETE  COMPARISON:  None.  FINDINGS: Right Kidney:  Length: 12.0 cm. Echogenicity within normal limits. No mass or hydronephrosis visualized.  Left Kidney:  Length: 11.5 cm. Echogenicity within normal limits. No mass or hydronephrosis visualized. 1 cm lower pole cyst.  Bladder:  Appears normal for degree of bladder distention. Normal ureteral jets.  IMPRESSION: Normal urinary tract ultrasound. No sonographic evidence of obstruction or advanced infection.  These results  will be called to the ordering clinician or representative by the Radiologist Assistant, and communication documented in the PACS or zVision Dashboard.   Electronically Signed   By: Paulina Fusi M.D.   On: 09/20/2017 17:25 I have independently reviewed the films  Assessment & Plan:    1. Frequency Resolved at this time  2. Food poisoning Patient may have had a bout of food poisoning or flu  RUS was negative   Return if symptoms worsen or fail to improve.  These notes generated with voice recognition software. I apologize for typographical errors.  Michiel Cowboy, PA-C  [facility] [address]  [address]  [place], [place] [address] [phone]

## 2017-10-17 ENCOUNTER — Encounter: Payer: Self-pay | Admitting: Urology

## 2017-10-17 ENCOUNTER — Ambulatory Visit (INDEPENDENT_AMBULATORY_CARE_PROVIDER_SITE_OTHER): Payer: Managed Care, Other (non HMO) | Admitting: Urology

## 2017-10-17 VITALS — BP 138/84 | HR 76 | Ht 69.0 in | Wt 211.6 lb

## 2017-10-17 DIAGNOSIS — R350 Frequency of micturition: Secondary | ICD-10-CM

## 2017-10-17 DIAGNOSIS — IMO0001 Reserved for inherently not codable concepts without codable children: Secondary | ICD-10-CM

## 2017-10-18 LAB — URINALYSIS, COMPLETE
BILIRUBIN UA: NEGATIVE
GLUCOSE, UA: NEGATIVE
KETONES UA: NEGATIVE
Nitrite, UA: NEGATIVE
Protein, UA: NEGATIVE
RBC UA: NEGATIVE
SPEC GRAV UA: 1.02 (ref 1.005–1.030)
UUROB: 0.2 mg/dL (ref 0.2–1.0)
pH, UA: 5.5 (ref 5.0–7.5)

## 2017-10-18 LAB — MICROSCOPIC EXAMINATION
BACTERIA UA: NONE SEEN
RBC MICROSCOPIC, UA: NONE SEEN /HPF (ref 0–2)

## 2017-10-18 LAB — BUN+CREAT
BUN / CREAT RATIO: 13 (ref 9–20)
BUN: 12 mg/dL (ref 6–24)
Creatinine, Ser: 0.93 mg/dL (ref 0.76–1.27)
GFR calc Af Amer: 117 mL/min/{1.73_m2} (ref >59)
GFR calc non Af Amer: 102 mL/min/{1.73_m2} (ref >59)

## 2017-10-20 LAB — CULTURE, URINE COMPREHENSIVE

## 2017-10-22 ENCOUNTER — Telehealth: Payer: Self-pay

## 2017-10-22 ENCOUNTER — Encounter: Payer: Self-pay | Admitting: Family Medicine

## 2017-10-22 ENCOUNTER — Ambulatory Visit (INDEPENDENT_AMBULATORY_CARE_PROVIDER_SITE_OTHER): Payer: Managed Care, Other (non HMO) | Admitting: Family Medicine

## 2017-10-22 VITALS — BP 135/89 | HR 86 | Temp 98.4°F | Resp 16 | Ht 69.0 in | Wt 209.0 lb

## 2017-10-22 DIAGNOSIS — L739 Follicular disorder, unspecified: Secondary | ICD-10-CM

## 2017-10-22 DIAGNOSIS — K219 Gastro-esophageal reflux disease without esophagitis: Secondary | ICD-10-CM

## 2017-10-22 DIAGNOSIS — R1011 Right upper quadrant pain: Secondary | ICD-10-CM | POA: Diagnosis not present

## 2017-10-22 DIAGNOSIS — R945 Abnormal results of liver function studies: Secondary | ICD-10-CM

## 2017-10-22 DIAGNOSIS — Z7689 Persons encountering health services in other specified circumstances: Secondary | ICD-10-CM | POA: Diagnosis not present

## 2017-10-22 DIAGNOSIS — R7989 Other specified abnormal findings of blood chemistry: Secondary | ICD-10-CM | POA: Insufficient documentation

## 2017-10-22 DIAGNOSIS — D573 Sickle-cell trait: Secondary | ICD-10-CM | POA: Diagnosis not present

## 2017-10-22 MED ORDER — SUCRALFATE 1 G PO TABS: 1.0000 g | ORAL_TABLET | Freq: Three times a day (TID) | ORAL | 0 refills | Status: DC

## 2017-10-22 MED ORDER — OMEPRAZOLE 20 MG PO CPDR: 20.0000 mg | DELAYED_RELEASE_CAPSULE | Freq: Every day | ORAL | 2 refills | Status: DC

## 2017-10-22 MED ORDER — MUPIROCIN 2 % EX OINT: 1.0000 "application " | TOPICAL_OINTMENT | Freq: Two times a day (BID) | CUTANEOUS | 0 refills | Status: DC | PRN

## 2017-10-22 NOTE — Assessment & Plan Note (Signed)
Suspected secondary due to elevated cholesterol or fatty liver. Possibly alcohol related as well. Reviewed prior labs from 04/2016 and 08/2017, consistent mild elevations Will re-check labs now including other diagnostics with A1c and Lipids - If not improving with lifestyle intervention in future will add future testing with RUQ [place]

## 2017-10-22 NOTE — Progress Notes (Signed)
Subjective:    Patient ID: Robert Smith, male    DOB: Jan 18, 1976, 42 y.o.   MRN: [id number]  Robert Smith is a 42 y.o. male presenting on 10/22/2017 for Establish Care; Abdominal Pain (RUQ episodic and epigastric); and Gastroesophageal Reflux  Here to establish care with new PCP now covered on insurance. He has seen Urology before.  HPI   [facility]  Abnormal Labs / Elevated Glucose / Elevated LFTs Recent las and prior lab showed some problem with elevated mild LFTs AST/ALT and also elevated fasting glucose. He is asking for further testing for these. He has fam history of DM  S/p vasectomy Followed by [facility], Dr Lonna CobbStoioff performed Vasectomy for contraception in 07/2017. He has also seen Urology in follow-up for constellation of symptoms see above, had testing done that showed normal Renal US, and normal urine studies. Labs were abnormal LFTs see above. - They thought R sided pain may be related to urethra or kidney but this was tested  Episodic Abdominal Pain RUQ / Epigastric / GERD Heartburn Reports chronic history of some episodes of heartburn, seems worse after certain meals. He has improved diet overall. Now more recent symptoms RUQ pain and some epigastric with episodes of pain and pressure, feels like gas pains, worse at night. He used to drink alcohol more regularly, now he has quit in past 2 months. Worse if eats larger meal after 7pm, he works late and has limited late night meals now doing better less symptoms. Triggered by certain foods, higher protein and other. Now eating more vegetables  Folliculitis, on back Reports areas of pustules or "acne" on his back, has worsened fairly recently worse with increased moisture and sweating. He has not tried any topicals, has tried to change soap, use unscented now with some improvement. - Asking about food allergy testing in future  Additional family history - other personal history he had  dengue fever as teenager, had anemia but this resolved - He has multiple half siblings, one younger half brother, Glioblastoma Multiforme - Patient has sickle cell trait, and believed his wife has same, he has a son with Sickle Cell Anemia  Health Maintenance: Will review chart, and he will return to discuss further at physical.  Due for routine HIV test, will draw with next labs. 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  Prostate CA Screening: Prior DRE (45g, normal done 10/17/17 by Urology) Last PSA 0.5 (09/20/17) per Urology. Currently asymptomatic. Known family history of prostate CA - paternal grandfather age 41 and paternal uncle. Recommend to continue yearly PSA screening.   Depression screen PHQ 2/9 10/22/2017  Decreased Interest 0  Down, Depressed, Hopeless 0  PHQ - 2 Score 0    Past Medical History:  Diagnosis Date  . Dengue fever    Past Surgical History:  Procedure Laterality Date  . IMPACTED THIRD MOLAR REMOVAL    . VASECTOMY  08/17/2017   [facility] Dr Lonna CobbStoioff   Social History   Socioeconomic History  . Marital status: Married    Spouse name: Not on file  . Number of children: 2  . Years of education: College  . Highest education level: Bachelor's degree (e.g., BA, AB, BS)  Occupational History  . [place]ation[place] [employer]  Social Needs  . Financial resource strain: Not on file  . Food insecurity:    Worry: Not on  file    Inability: Not on file  . Transportation needs:    Medical: Not on file    Non-medical: Not on file  Tobacco Use  . Smoking status: Former Research scientist (life sciences)  . Smokeless tobacco: Former Network engineer and Sexual Activity  . Alcohol use: No  . Drug use: Never  . Sexual activity: Not on file  Lifestyle  . Physical activity:    Days per week: Not on file    Minutes per session: Not on file  . Stress: Not on file  Relationships  . Social connections:    Talks on phone:  Not on file    Gets together: Not on file    Attends religious service: Not on file    Active member of club or organization: Not on file    Attends meetings of clubs or organizations: Not on file    Relationship status: Not on file  . Intimate partner violence:    Fear of current or ex partner: Not on file    Emotionally abused: Not on file    Physically abused: Not on file    Forced sexual activity: Not on file  Other Topics Concern  . Not on file  Social History Narrative  . Not on file   Family History  Problem Relation Age of Onset  . Sickle cell trait Son   . Sickle cell anemia Son   . Hypertension Mother   . Hyperlipidemia Mother   . Heart disease Father   . Heart attack Father   . Prostate cancer Paternal Grandfather 74       dx age 15  . Prostate cancer Paternal Uncle   . Diabetes Maternal Grandmother   . Brain cancer Other        Glioblastoma Multiforme  . Bladder Cancer Neg Hx   . Kidney cancer Neg Hx    No current outpatient medications on file prior to visit.   No current facility-administered medications on file prior to visit.     Review of Systems  Constitutional: Negative for activity change, appetite change, chills, diaphoresis, fatigue and fever.  HENT: Negative for congestion, hearing loss and sinus pressure.   Eyes: Negative for discharge and visual disturbance.  Respiratory: Negative for apnea, cough, choking, chest tightness, shortness of breath and wheezing.   Cardiovascular: Negative for chest pain, palpitations and leg swelling.  Gastrointestinal: Positive for abdominal pain (RUQ and epigastric, mild discomfort now but seems episodic). Negative for anal bleeding, blood in stool, constipation, diarrhea, nausea and vomiting.  Endocrine: Negative for cold intolerance.  Genitourinary: Negative for decreased urine volume, difficulty urinating, dysuria, frequency, hematuria, testicular pain and urgency.  Musculoskeletal: Negative for arthralgias,  back pain and neck pain.  Skin: Positive for rash (on back, some areas of pustule or itching).  Allergic/Immunologic: Negative for environmental allergies.  Neurological: Negative for dizziness, weakness, light-headedness, numbness and headaches.  Hematological: Negative for adenopathy.  Psychiatric/Behavioral: Negative for behavioral problems, dysphoric mood and sleep disturbance. The patient is not nervous/anxious.    Per HPI unless specifically indicated above     Objective:    BP 135/89   Pulse 86   Temp 98.4 F (36.9 C) (Oral)   Resp 16   Ht 5\' 9"  (1.753 m)   Wt 209 lb (94.8 kg)   BMI 30.86 kg/m   Wt Readings from Last 3 Encounters:  10/22/17 209 lb (94.8 kg)  10/17/17 211 lb 9.6 oz (96 kg)  08/17/17 209 lb 6.4 oz (95  kg)    Physical Exam  Constitutional: He is oriented to person, place, and time. He appears well-developed and well-nourished. No distress.  Well-appearing, comfortable, cooperative  HENT:  Head: Normocephalic and atraumatic.  Mouth/Throat: Oropharynx is clear and moist.  Eyes: Conjunctivae are normal. Right eye exhibits no discharge. Left eye exhibits no discharge.  Neck: Normal range of motion. Neck supple. No thyromegaly present.  Cardiovascular: Normal rate, regular rhythm, normal heart sounds and intact distal pulses.  No murmur heard. Pulmonary/Chest: Effort normal and breath sounds normal. No respiratory distress. He has no wheezes. He has no rales.  Abdominal: Bowel sounds are normal. He exhibits no distension and no abdominal bruit. There is no hepatosplenomegaly. There is tenderness in the epigastric area. There is no rigidity, no rebound, no guarding, no CVA tenderness, no tenderness at McBurney's point and negative Murphy's sign. No hernia.  Musculoskeletal: Normal range of motion. He exhibits no edema.  Lymphadenopathy:    He has no cervical adenopathy.  Neurological: He is alert and oriented to person, place, and time.  Skin: Skin is warm and  dry. He is not diaphoretic. No erythema.  Back - Scattered areas across most of central back upper and lower of various stages, appear to be folliculitis or clogged pores with some white head or pustules, others dry and healing, no significant erythema or other rash  Psychiatric: He has a normal mood and affect. His behavior is normal.  Well groomed, good eye contact, normal speech and thoughts  Nursing note and vitals reviewed.  Results for orders placed or performed in visit on 10/17/17  CULTURE, URINE COMPREHENSIVE  Result Value Ref Range   Urine Culture, Comprehensive Final report    Organism ID, Bacteria Comment   Microscopic Examination  Result Value Ref Range   WBC, UA 0-5 0 - 5 /hpf   RBC, UA None seen 0 - 2 /hpf   Epithelial Cells (non renal) 0-10 0 - 10 /hpf   Bacteria, UA None seen None seen/Few  Urinalysis, Complete  Result Value Ref Range   Specific Gravity, UA 1.020 1.005 - 1.030   pH, UA 5.5 5.0 - 7.5   Color, UA Yellow Yellow   Appearance Ur Clear Clear   Leukocytes, UA Trace (A) Negative   Protein, UA Negative Negative/Trace   Glucose, UA Negative Negative   Ketones, UA Negative Negative   RBC, UA Negative Negative   Bilirubin, UA Negative Negative   Urobilinogen, Ur 0.2 0.2 - 1.0 mg/dL   Nitrite, UA Negative Negative   Microscopic Examination See below:   BUN+Creat  Result Value Ref Range   BUN 12 6 - 24 mg/dL   Creatinine, Ser 0.93 0.76 - 1.27 mg/dL   GFR calc non Af Amer 102 >59 mL/min/1.73   GFR calc Af Amer 117 >59 mL/min/1.73   BUN/Creatinine Ratio 13 9 - 20      Assessment & Plan:   Problem List Items Addressed This Visit    Elevated LFTs    Suspected secondary due to elevated cholesterol or fatty liver. Possibly alcohol related as well. Reviewed prior labs from 04/2016 and 08/2017, consistent mild elevations Will re-check labs now including other diagnostics with A1c and Lipids - If not improving with lifestyle intervention in future will add  future testing with RUQ [place]      Gastroesophageal reflux disease - Primary    Suspect uncontrolled GERD vs possible ulcer uncertain trigger, may be dietary No frequent NSAIDs No other significant GI red flags (  no unintentional wt loss, night-sweats, refractory abdominal pain n/v, hematemesis or melena).  Plan: 1. Start PPI trial with Omeprazole 20mg  daily for 4-8 weeks 2. Return tomorrow to check H Pylori breath test (given in office) - never on PPI - but not fasting today - if POSITIVE then will recommend changes: increase PPI to 40mg  BID dosing, add on triple therapy antibiotics with Metronidazole due to PCN allergy and clarithromycin (500mg  BID) for 2 weeks, then resume daily PPI only 3. Start (after breath test, tomorrow) empiric prolonged PPI trial with Omeprazole as pescribed 4. Start carafate 1g TID WC + QHS PRN for symptom relief  Dietary changes - avoid late night meals, continue improved plan no food or limited after 7pm, avoid caffeine, elevate head of bed, limit trigger foods, remain off alcohol  5. Strict return criteria given for worsening symptoms 6. Follow-up within 1-2 weeks, if no improvement, will refer to GI for further evaluation, may need future EGD given chronicity of problem. If gradual improvement will continue course - he is scheduled to return for physical as planned within 3 weeks      Relevant Medications   omeprazole (PRILOSEC) 20 MG capsule   sucralfate (CARAFATE) 1 g tablet   Other Relevant Orders   H. pylori breath test   Sickle cell trait (HCC)    Asymptomatic currently Based on history No documented definitive test available at this time Reassurance, follow-up       Other Visit Diagnoses    Encounter to establish care with new doctor    Request records from prior community [facility] prior PCP      RUQ abdominal pain     See above A&P for GERD, likely related to this vs potential for hepatic vs gallbladder seems less likely based on history.  No evidence acute choley currently. Will consider RUQ [place] if unresolved on PPI     Folliculitis     Likely related to increased moisture and sweating / scratching Will cover empiric treatment topical antibiotic May add OTC cortisone, future can use triamcinolone if persistent itching or irritation Follow-up     Relevant Medications   mupirocin ointment (BACTROBAN) 2 %      Meds ordered this encounter  Medications  . omeprazole (PRILOSEC) 20 MG capsule    Sig: Take 1 capsule (20 mg total) by mouth daily before breakfast.    Dispense:  30 capsule    Refill:  2  . sucralfate (CARAFATE) 1 g tablet    Sig: Take 1 tablet (1 g total) by mouth 4 (four) times daily -  with meals and at bedtime. As needed for heartburn    Dispense:  30 tablet    Refill:  0  . mupirocin ointment (BACTROBAN) 2 %    Sig: Apply 1 application topically 2 (two) times daily as needed. As needed for 1 week. Apply to back as needed spots only for skin infection    Dispense:  30 g    Refill:  0    Follow up plan: Return in about 3 weeks (around 11/12/2017) for Annual Physical.   Ordered H Pylori Breath Test to be done tomorrow 10/23/17 in office, since he is not fasting today  Future labs will be ordered for 11/07/17 for annual physical A1c, CMET, Lipid, A1c, HIV  Saralyn Pilar, DO [facility] 10/22/2017, 1:36 PM

## 2017-10-22 NOTE — Patient Instructions (Addendum)
Thank you for coming to the office today.  I am not sure the exact cause of your abdominal pain, however I am concerned that one significant possibility could be uncontrolled Acid Reflux (GERD) and possibly it may be related to gallbladder problem  I think it is less likely to be your Liver. But we discussed liver enzyme blood test may show some problem with Cholesterol - we will check the blood  After you have completed your H Pylori Breath Test TOMORROW, we can start with the prescribed medicines, and we will notify you of your result and if we have to change treatment.  Before starting the prescribed treatment you need to complete the H Pylori Breath Test, and you need to be FASTING for 4 hours before the test, NO FOOD OR DRINK, only small amount of water is fine, can take other medicines).  IF BREATH TEST = NEGATIVE (or waiting for results): Starting ONCE WE TELL YOU RESULT - go ahead and take Stomach Acid Medicine - Omeprazole 20mg . Prefer to take this med about 30 min before breakfast or 1st meal of day for 4 to 8 weeks, don't stop taking unless we discuss first. Probably will need for about 8 weeks total if this ends up being an Ulcer.  Take other prescribed medicine Carafate (Sucralfate) as needed up to 4 times daily (3 meals and bedtime)  to coat stomach lining to ease symptoms, if it helps reduce symptoms then it is more likely to be due to acid and/or ulcer.  IF BREATH TEST = POSITIVE (we notified you of this result): - CHANGE Protonix dose from 40mg  daily to 40mg  (one pill) TWICE daily, about 30 min before breakfast and dinner - FOR TWO WEEKS, then resume DAILY treatment - Also we will send in TWO antibiotics to take IN ADDITION: - PCN Allergy Metronidazole 500mg  TID / Clarithromycin 500mg  TWICE daily - both for 2 weeks  DIET RECOMMENDATIONS - Avoid spicy, greasy, fried foods, also things like caffeine, dark chocolate, peppermint can worsen - Avoid large meals and late night  snacks, also do not go more than 4-5 hours without a snack or meal (not eating will worsen reflux symptoms due to stomach acid) - You may also elevate the head of your bed at night to sleep at very slight incline to help reduce symptoms  If the problem improves but keeps coming back, we can discuss higher dose or longer course at next visit.  If symptoms are worsening or persistent despite treatment or develop any different severe esophagus or abdominal pain, unable to swallow solids or liquids, nausea, vomiting especially blood in vomit, fever/chills, or unintentional weight loss / no appetite, please follow-up sooner in office or seek more immediate medical attention at hospital Emergency Department.  Regarding other medicines:  - STOP taking Ibuprofen, Advil, Motrin, Goody's / BC powder - DO NOT take without discussing with your doctor. These medicines can put you at high risk for future bleeding.   If need pain medicine, may take Tylenol Extra Strength (Acetaminophen) 500mg  tabs - take 1 to 2 tabs per dose (max 1000mg ) every 6-8 hours for pain (take regularly, don't skip a dose for next 7 days), max 24 hour daily dose is 6 tablets or 3000mg . In the future you can repeat the same everyday Tylenol course for 1-2 weeks at a time.  DUE for FASTING BLOOD WORK (no food or drink after midnight before the lab appointment, only water or coffee without cream/sugar on the morning of)  SCHEDULE "Lab Only" visit in the morning at the clinic for lab draw in 2 WEEKS   - Make sure Lab Only appointment is at about 1 week before your next appointment, so that results will be available  For Lab Results, once available within 2-3 days of blood draw, you can can log in to MyChart online to view your results and a brief explanation. Also, we can discuss results at next follow-up visit.   Please schedule a Follow-up Appointment to: Return in about 3 weeks (around 11/12/2017) for Annual Physical.  If you have  any other questions or concerns, please feel free to call the office or send a message through MyChart. You may also schedule an earlier appointment if necessary.  Additionally, you may be receiving a survey about your experience at our office within a few days to 1 week by e-mail or mail. We value your feedback.  Saralyn Pilar, DO [facility], [facility], [facility], [place]

## 2017-10-22 NOTE — Telephone Encounter (Signed)
-----   Message from Harle BattiestShannon A McGowan, PA-C sent at 10/20/2017 10:40 AM EDT ----- Please let Robert Smith know that his urine culture was negative.

## 2017-10-22 NOTE — Assessment & Plan Note (Addendum)
Suspect uncontrolled GERD vs possible ulcer uncertain trigger, may be dietary No frequent NSAIDs No other significant GI red flags (no unintentional wt loss, night-sweats, refractory abdominal pain n/v, hematemesis or melena).  Plan: 1. Start PPI trial with Omeprazole 20mg  daily for 4-8 weeks 2. Return tomorrow to check H Pylori breath test (given in office) - never on PPI - but not fasting today - if POSITIVE then will recommend changes: increase PPI to 40mg  BID dosing, add on triple therapy antibiotics with Metronidazole due to PCN allergy and clarithromycin (500mg  BID) for 2 weeks, then resume daily PPI only 3. Start (after breath test, tomorrow) empiric prolonged PPI trial with Omeprazole as pescribed 4. Start carafate 1g TID WC + QHS PRN for symptom relief  Dietary changes - avoid late night meals, continue improved plan no food or limited after 7pm, avoid caffeine, elevate head of bed, limit trigger foods, remain off alcohol  5. Strict return criteria given for worsening symptoms 6. Follow-up within 1-2 weeks, if no improvement, will refer to GI for further evaluation, may need future EGD given chronicity of problem. If gradual improvement will continue course - he is scheduled to return for physical as planned within 3 weeks

## 2017-10-22 NOTE — Telephone Encounter (Signed)
Pt informed

## 2017-10-22 NOTE — Assessment & Plan Note (Signed)
Asymptomatic currently Based on history No documented definitive test available at this time Reassurance, follow-up

## 2017-10-23 ENCOUNTER — Other Ambulatory Visit: Payer: Managed Care, Other (non HMO)

## 2017-10-23 DIAGNOSIS — K219 Gastro-esophageal reflux disease without esophagitis: Secondary | ICD-10-CM

## 2017-10-24 ENCOUNTER — Telehealth: Payer: Self-pay | Admitting: Family Medicine

## 2017-10-24 ENCOUNTER — Other Ambulatory Visit: Payer: Self-pay | Admitting: Family Medicine

## 2017-10-24 DIAGNOSIS — K219 Gastro-esophageal reflux disease without esophagitis: Secondary | ICD-10-CM

## 2017-10-24 DIAGNOSIS — A048 Other specified bacterial intestinal infections: Secondary | ICD-10-CM

## 2017-10-24 DIAGNOSIS — D573 Sickle-cell trait: Secondary | ICD-10-CM

## 2017-10-24 DIAGNOSIS — R7309 Other abnormal glucose: Secondary | ICD-10-CM

## 2017-10-24 DIAGNOSIS — Z0000 Encounter for general adult medical examination without abnormal findings: Secondary | ICD-10-CM

## 2017-10-24 DIAGNOSIS — R7989 Other specified abnormal findings of blood chemistry: Secondary | ICD-10-CM

## 2017-10-24 DIAGNOSIS — Z114 Encounter for screening for human immunodeficiency virus [HIV]: Secondary | ICD-10-CM

## 2017-10-24 DIAGNOSIS — R945 Abnormal results of liver function studies: Secondary | ICD-10-CM

## 2017-10-24 LAB — H. PYLORI BREATH TEST: H. PYLORI BREATH TEST: DETECTED — AB

## 2017-10-24 MED ORDER — METRONIDAZOLE 500 MG PO TABS: 500.0000 mg | ORAL_TABLET | Freq: Three times a day (TID) | ORAL | 0 refills | Status: DC

## 2017-10-24 MED ORDER — CLARITHROMYCIN 500 MG PO TABS: 500.0000 mg | ORAL_TABLET | Freq: Two times a day (BID) | ORAL | 0 refills | Status: DC

## 2017-10-24 NOTE — Telephone Encounter (Signed)
Last seen 10/22/17 for new patient visit and discussion of GERD symptoms.  He completed H Pylori breath test and then initiated therapy with Omeprazole 20mg  daily.  Now results show DETECTED positive H Pylori.  Attempted to call patient. Did not reach him. Left a voicemail with all information on his personal voicemail. Also I have already given him AVS at last visit that explained what to do if H Pylori test was positive.  Now - Start Triple Therapy - 2 weeks - Daily Omeprazole 20mg  INCREASE TO TWICE DAILY dosing on PPI x 2 wk > then RETURN TO WEEKLY DOSING - He may run out of rx Omeprazole, he can purchase OTC Omeprazole 20mg  and use this in addition - Allergy to Amox/PCN > Metronidazole 500mg  TID / Clarithromycin 500mg  BID x 2 wk  Saralyn PilarAlexander Jordani Nunn, DO [facility] 10/24/2017, 5:35 PM

## 2017-10-26 NOTE — Telephone Encounter (Signed)
Called patient again to confirm. Spoke with Robert Smith, he received message, he is in agreement with plan and will start now.  Robert PilarAlexander Karamalegos, DO [facility] 10/26/2017, 10:10 AM

## 2017-11-07 ENCOUNTER — Other Ambulatory Visit: Payer: Managed Care, Other (non HMO)

## 2017-11-07 ENCOUNTER — Encounter (INDEPENDENT_AMBULATORY_CARE_PROVIDER_SITE_OTHER): Payer: Self-pay

## 2017-11-07 DIAGNOSIS — Z114 Encounter for screening for human immunodeficiency virus [HIV]: Secondary | ICD-10-CM

## 2017-11-07 DIAGNOSIS — R7309 Other abnormal glucose: Secondary | ICD-10-CM

## 2017-11-07 DIAGNOSIS — R7989 Other specified abnormal findings of blood chemistry: Secondary | ICD-10-CM

## 2017-11-07 DIAGNOSIS — R945 Abnormal results of liver function studies: Secondary | ICD-10-CM

## 2017-11-07 DIAGNOSIS — Z0000 Encounter for general adult medical examination without abnormal findings: Secondary | ICD-10-CM

## 2017-11-07 DIAGNOSIS — D573 Sickle-cell trait: Secondary | ICD-10-CM

## 2017-11-08 ENCOUNTER — Encounter: Payer: Self-pay | Admitting: Family Medicine

## 2017-11-08 DIAGNOSIS — R7309 Other abnormal glucose: Secondary | ICD-10-CM | POA: Insufficient documentation

## 2017-11-08 DIAGNOSIS — E782 Mixed hyperlipidemia: Secondary | ICD-10-CM | POA: Insufficient documentation

## 2017-11-08 LAB — CBC WITH DIFFERENTIAL/PLATELET
Basophils Absolute: 60 cells/uL (ref 0–200)
Basophils Relative: 1 %
Eosinophils Absolute: 168 cells/uL (ref 15–500)
Eosinophils Relative: 2.8 %
HEMATOCRIT: 43.7 % (ref 38.5–50.0)
Hemoglobin: 14.8 g/dL (ref 13.2–17.1)
LYMPHS ABS: 2778 {cells}/uL (ref 850–3900)
MCH: 28.3 pg (ref 27.0–33.0)
MCHC: 33.9 g/dL (ref 32.0–36.0)
MCV: 83.6 fL (ref 80.0–100.0)
MPV: 9.5 fL (ref 7.5–12.5)
Monocytes Relative: 10 %
NEUTROS PCT: 39.9 %
Neutro Abs: 2394 cells/uL (ref 1500–7800)
PLATELETS: 265 10*3/uL (ref 140–400)
RBC: 5.23 10*6/uL (ref 4.20–5.80)
RDW: 13.4 % (ref 11.0–15.0)
TOTAL LYMPHOCYTE: 46.3 %
WBC: 6 10*3/uL (ref 3.8–10.8)
WBCMIX: 600 {cells}/uL (ref 200–950)

## 2017-11-08 LAB — COMPLETE METABOLIC PANEL WITH GFR
AG Ratio: 1.5 (calc) (ref 1.0–2.5)
ALKALINE PHOSPHATASE (APISO): 80 U/L (ref 40–115)
ALT: 35 U/L (ref 9–46)
AST: 23 U/L (ref 10–40)
Albumin: 4.6 g/dL (ref 3.6–5.1)
BILIRUBIN TOTAL: 0.9 mg/dL (ref 0.2–1.2)
BUN: 10 mg/dL (ref 7–25)
CHLORIDE: 101 mmol/L (ref 98–110)
CO2: 25 mmol/L (ref 20–32)
CREATININE: 0.88 mg/dL (ref 0.60–1.35)
Calcium: 10 mg/dL (ref 8.6–10.3)
GFR, Est African American: 124 mL/min/{1.73_m2} (ref >=60)
GFR, Est Non African American: 107 mL/min/{1.73_m2} (ref >=60)
GLOBULIN: 3.1 g/dL (ref 1.9–3.7)
GLUCOSE: 92 mg/dL (ref 65–99)
Potassium: 4.1 mmol/L (ref 3.5–5.3)
SODIUM: 137 mmol/L (ref 135–146)
Total Protein: 7.7 g/dL (ref 6.1–8.1)

## 2017-11-08 LAB — LIPID PANEL
Cholesterol: 190 mg/dL (ref <200)
HDL: 29 mg/dL — ABNORMAL LOW (ref >40)
Non-HDL Cholesterol (Calc): 161 mg/dL (calc) — ABNORMAL HIGH (ref <130)
TRIGLYCERIDES: 426 mg/dL — AB (ref <150)
Total CHOL/HDL Ratio: 6.6 (calc) — ABNORMAL HIGH (ref <5.0)

## 2017-11-08 LAB — HEMOGLOBIN A1C
EAG (MMOL/L): 6.8 (calc)
Hgb A1c MFr Bld: 5.9 % of total Hgb — ABNORMAL HIGH (ref <5.7)
Mean Plasma Glucose: 123 (calc)

## 2017-11-08 LAB — HIV ANTIBODY (ROUTINE TESTING W REFLEX): HIV: NONREACTIVE

## 2017-11-09 ENCOUNTER — Other Ambulatory Visit: Payer: Self-pay

## 2017-11-09 ENCOUNTER — Other Ambulatory Visit: Payer: Medicaid Other

## 2017-11-09 DIAGNOSIS — Z9852 Vasectomy status: Secondary | ICD-10-CM

## 2017-11-14 ENCOUNTER — Ambulatory Visit (INDEPENDENT_AMBULATORY_CARE_PROVIDER_SITE_OTHER): Payer: Managed Care, Other (non HMO) | Admitting: Family Medicine

## 2017-11-14 ENCOUNTER — Other Ambulatory Visit: Payer: Self-pay

## 2017-11-14 ENCOUNTER — Encounter: Payer: Managed Care, Other (non HMO) | Admitting: Family Medicine

## 2017-11-14 ENCOUNTER — Other Ambulatory Visit: Payer: Self-pay | Admitting: Family Medicine

## 2017-11-14 ENCOUNTER — Encounter: Payer: Self-pay | Admitting: Family Medicine

## 2017-11-14 VITALS — BP 128/84 | HR 72 | Temp 98.2°F | Ht 69.0 in | Wt 203.6 lb

## 2017-11-14 DIAGNOSIS — K219 Gastro-esophageal reflux disease without esophagitis: Secondary | ICD-10-CM

## 2017-11-14 DIAGNOSIS — A048 Other specified bacterial intestinal infections: Secondary | ICD-10-CM

## 2017-11-14 DIAGNOSIS — Z0000 Encounter for general adult medical examination without abnormal findings: Secondary | ICD-10-CM | POA: Diagnosis not present

## 2017-11-14 DIAGNOSIS — R945 Abnormal results of liver function studies: Secondary | ICD-10-CM | POA: Diagnosis not present

## 2017-11-14 DIAGNOSIS — R7309 Other abnormal glucose: Secondary | ICD-10-CM

## 2017-11-14 DIAGNOSIS — D573 Sickle-cell trait: Secondary | ICD-10-CM

## 2017-11-14 DIAGNOSIS — R7989 Other specified abnormal findings of blood chemistry: Secondary | ICD-10-CM

## 2017-11-14 DIAGNOSIS — E782 Mixed hyperlipidemia: Secondary | ICD-10-CM

## 2017-11-14 NOTE — Assessment & Plan Note (Signed)
Resolved Normalized LFTs on re-check, last elevated 08/2017 Has hyperTG and elevated A1c Will improve lifestyle diet/exercise Follow-up labs again in 6 months

## 2017-11-14 NOTE — Assessment & Plan Note (Signed)
Asymptomatic currently Based on history No documented definitive test available at this time Lab shows normal Hgb and other CBC markers Reassurance, follow-up

## 2017-11-14 NOTE — Assessment & Plan Note (Signed)
Concern newly identified elevated A1c 5.9, no prior comparison Concern with HLD  Plan:  1. Not on any therapy currently  2. Encourage improved lifestyle - low carb, low sugar diet, reduce portion size, continue improving regular exercise 3. Follow-up 6 months PreDM A1c re-check trend

## 2017-11-14 NOTE — Progress Notes (Signed)
Subjective:    Patient ID: Robert Smith, male    DOB: 04-27-1975, 42 y.o.   MRN: [id number]  Robert Smith is a 42 y.o. male presenting on 11/14/2017 for Annual Exam   HPI   Here for Annual Physical and Lab Review  Follow-up Lab results Resolved LFTs, previously 08/2017 had mild elevated LFTs. Now these have normalized.  Elevated Fasting Sugar / Elevated A1c Recent testing showed elevated fasting sugar. Now labs showed A1c 5.9, no prior lab testing for comparison. Lifestyle - Diet: Reducing carbs/starches/sugars he is dramatically changed his diet and improving. Increased water intake. - Exercise: Regular exercising improving  FOLLOW-UP GERD / positive H Pylori Infection - Last visit with me 10/22/17, for initial visit for establish care and same problem, tested with H Pylori breath test POSITIVE, he was dx with GERD and treated with triple therapy with antibiotics (w/ PCN allergy) on Metronidazole and Clarithromycin had some mild diarrhea at first then tolerated well and Omeprazole 20 BID for 2 weeks, see prior notes for background information. - Interval update with symptoms of heartburn GERD dramatically improved, now mostly resolved, he has finished antibiotics and BID dosing - Today patient reports he is doing well, now only on Omeprazole 20mg  daily before breakfast - Symptoms of heartburn and epigastric discomfort dramatically improved, now 90% resolved. Also he can bend forward without symptoms of fullness and discomfort in upper abdomen - he has improved diet, limiting fried fatty greasy spicy trigger foods  History of Sickle Cell Trait - Reviewed CBC, he has normal hemoglobin. He does not endorse any symptoms of this today.  Additional family history - other personal history he had dengue fever as teenager, had anemia but this resolved - He has multiple half siblings, one younger half brother, Glioblastoma Multiforme - Patient has sickle cell trait, and believed his wife has same,  he has a son with Sickle Cell Anemia  Health Maintenance:  UTD Routine HIV screen negative.  Prostate CA Screening: Prior DRE (45g, normal done 10/17/17 by Urology) Last PSA 0.5 (09/20/17) per Urology. Currently asymptomatic. Known family history of prostate CA - paternal grandfather age 81 and paternal uncle. Recommend to continue yearly PSA screening.  Not due for early colon cancer screening.   Depression screen PHQ 2/9 10/22/2017  Decreased Interest 0  Down, Depressed, Hopeless 0  PHQ - 2 Score 0    Past Medical History:  Diagnosis Date  . Dengue fever    Past Surgical History:  Procedure Laterality Date  . IMPACTED THIRD MOLAR REMOVAL    . VASECTOMY  08/17/2017   [facility] Dr Bernardo Heater   Social History   Socioeconomic History  . Marital status: Married    Spouse name: Not on file  . Number of children: 2  . Years of education: College  . Highest education level: Bachelor's degree (e.g., BA, AB, BS)  Occupational History  . Occupation: Education officer, environmental  Social Needs  . Financial resource strain: Not on file  . Food insecurity:    Worry: Not on file    Inability: Not on file  . Transportation needs:    Medical: Not on file    Non-medical: Not on file  Tobacco Use  . Smoking status: Former Research scientist (life sciences)  . Smokeless tobacco: Former Network engineer and Sexual Activity  . Alcohol use: No  . Drug use: Never  . Sexual activity: Not on file  Lifestyle  . Physical activity:    Days per week: Not  on file    Minutes per session: Not on file  . Stress: Not on file  Relationships  . Social connections:    Talks on phone: Not on file    Gets together: Not on file    Attends religious service: Not on file    Active member of club or organization: Not on file    Attends meetings of clubs or organizations: Not on file    Relationship status: Not on file  . Intimate partner violence:    Fear of current or ex partner: Not on file    Emotionally abused:  Not on file    Physically abused: Not on file    Forced sexual activity: Not on file  Other Topics Concern  . Not on file  Social History Narrative  . Not on file   Family History  Problem Relation Age of Onset  . Sickle cell trait Son   . Sickle cell anemia Son   . Hypertension Mother   . Hyperlipidemia Mother   . Heart disease Father   . Heart attack Father   . Prostate cancer Paternal Grandfather 34       dx age 64  . Prostate cancer Paternal Uncle   . Diabetes Maternal Grandmother   . Brain cancer Other        Glioblastoma Multiforme  . Bladder Cancer Neg Hx   . Kidney cancer Neg Hx    Current Outpatient Medications on File Prior to Visit  Medication Sig  . omeprazole (PRILOSEC) 20 MG capsule Take 1 capsule (20 mg total) by mouth daily before breakfast.  . mupirocin ointment (BACTROBAN) 2 % Apply 1 application topically 2 (two) times daily as needed. As needed for 1 week. Apply to back as needed spots only for skin infection (Patient not taking: Reported on 11/14/2017)   No current facility-administered medications on file prior to visit.     Review of Systems  Constitutional: Negative for activity change, appetite change, chills, diaphoresis, fatigue and fever.  HENT: Negative for congestion and hearing loss.   Eyes: Negative for visual disturbance.  Respiratory: Negative for apnea, cough, choking, chest tightness, shortness of breath and wheezing.   Cardiovascular: Negative for chest pain, palpitations and leg swelling.  Gastrointestinal: Negative for abdominal pain, anal bleeding, blood in stool, constipation, diarrhea, nausea and vomiting.       Heartburn, epigastric discomfort - nearly resolved  Endocrine: Negative for cold intolerance.  Genitourinary: Negative for decreased urine volume, difficulty urinating, dysuria, frequency, hematuria, testicular pain and urgency.  Musculoskeletal: Negative for arthralgias, back pain and neck pain.  Skin: Negative for rash.    Allergic/Immunologic: Negative for environmental allergies.  Neurological: Negative for dizziness, weakness, light-headedness, numbness and headaches.  Hematological: Negative for adenopathy.  Psychiatric/Behavioral: Negative for behavioral problems, dysphoric mood and sleep disturbance. The patient is not nervous/anxious.    Per HPI unless specifically indicated above     Objective:    BP 128/84 (BP Location: Left Arm, Cuff Size: Normal)   Pulse 72   Temp 98.2 F (36.8 C) (Oral)   Ht 5\' 9"  (1.753 m)   Wt 203 lb 9.6 oz (92.4 kg)   BMI 30.07 kg/m   Wt Readings from Last 3 Encounters:  11/14/17 203 lb 9.6 oz (92.4 kg)  10/22/17 209 lb (94.8 kg)  10/17/17 211 lb 9.6 oz (96 kg)    Physical Exam  Constitutional: He is oriented to person, place, and time. He appears well-developed and well-nourished. No distress.  Well-appearing, comfortable, cooperative, athletic build  HENT:  Head: Normocephalic and atraumatic.  Mouth/Throat: Oropharynx is clear and moist.  Frontal / maxillary sinuses non-tender. Nares patent without purulence or edema. Bilateral TMs clear without erythema, effusion or bulging. Oropharynx clear without erythema, exudates, edema or asymmetry.  Eyes: Pupils are equal, round, and reactive to light. Conjunctivae and EOM are normal. Right eye exhibits no discharge. Left eye exhibits no discharge.  Neck: Normal range of motion. Neck supple. No thyromegaly present.  Cardiovascular: Normal rate, regular rhythm, normal heart sounds and intact distal pulses.  No murmur heard. Pulmonary/Chest: Effort normal and breath sounds normal. No respiratory distress. He has no wheezes. He has no rales.  Abdominal: Soft. Bowel sounds are normal. He exhibits no distension and no mass. There is no tenderness.  Musculoskeletal: Normal range of motion. He exhibits no edema or tenderness.  Upper / Lower Extremities: - Normal muscle tone, strength bilateral upper extremities 5/5, lower  extremities 5/5  Lymphadenopathy:    He has no cervical adenopathy.  Neurological: He is alert and oriented to person, place, and time.  Distal sensation intact to light touch all extremities  Skin: Skin is warm and dry. No rash noted. He is not diaphoretic. No erythema.  Psychiatric: He has a normal mood and affect. His behavior is normal.  Well groomed, good eye contact, normal speech and thoughts  Nursing note and vitals reviewed.  Results for orders placed or performed in visit on 11/07/17  HIV antibody  Result Value Ref Range   HIV 1&2 Ab, 4th Generation NON-REACTIVE NON-REACTI  Lipid panel  Result Value Ref Range   Cholesterol 190 <200 mg/dL   HDL 29 (L) >40 mg/dL   Triglycerides 426 (H) <150 mg/dL   LDL Cholesterol (Calc)  mg/dL (calc)   Total CHOL/HDL Ratio 6.6 (H) <5.0 (calc)   Non-HDL Cholesterol (Calc) 161 (H) <130 mg/dL (calc)  COMPLETE METABOLIC PANEL WITH GFR  Result Value Ref Range   Glucose, Bld 92 65 - 99 mg/dL   BUN 10 7 - 25 mg/dL   Creat 0.88 0.60 - 1.35 mg/dL   GFR, Est Non African American 107 > OR = 60 mL/min/1.44m2   GFR, Est African American 124 > OR = 60 mL/min/1.45m2   BUN/Creatinine Ratio NOT APPLICABLE 6 - 22 (calc)   Sodium 137 135 - 146 mmol/L   Potassium 4.1 3.5 - 5.3 mmol/L   Chloride 101 98 - 110 mmol/L   CO2 25 20 - 32 mmol/L   Calcium 10.0 8.6 - 10.3 mg/dL   Total Protein 7.7 6.1 - 8.1 g/dL   Albumin 4.6 3.6 - 5.1 g/dL   Globulin 3.1 1.9 - 3.7 g/dL (calc)   AG Ratio 1.5 1.0 - 2.5 (calc)   Total Bilirubin 0.9 0.2 - 1.2 mg/dL   Alkaline phosphatase (APISO) 80 40 - 115 U/L   AST 23 10 - 40 U/L   ALT 35 9 - 46 U/L  CBC with Differential/Platelet  Result Value Ref Range   WBC 6.0 3.8 - 10.8 Thousand/uL   RBC 5.23 4.20 - 5.80 Million/uL   Hemoglobin 14.8 13.2 - 17.1 g/dL   HCT 43.7 38.5 - 50.0 %   MCV 83.6 80.0 - 100.0 fL   MCH 28.3 27.0 - 33.0 pg   MCHC 33.9 32.0 - 36.0 g/dL   RDW 13.4 11.0 - 15.0 %   Platelets 265 140 - 400  Thousand/uL   MPV 9.5 7.5 - 12.5 fL   Neutro Abs  2,394 1,500 - 7,800 cells/uL   Lymphs Abs 2,778 850 - 3,900 cells/uL   WBC mixed population 600 200 - 950 cells/uL   Eosinophils Absolute 168 15 - 500 cells/uL   Basophils Absolute 60 0 - 200 cells/uL   Neutrophils Relative % 39.9 %   Total Lymphocyte 46.3 %   Monocytes Relative 10.0 %   Eosinophils Relative 2.8 %   Basophils Relative 1.0 %  Hemoglobin A1c  Result Value Ref Range   Hgb A1c MFr Bld 5.9 (H) <5.7 % of total Hgb   Mean Plasma Glucose 123 (calc)   eAG (mmol/L) 6.8 (calc)      Assessment & Plan:   Problem List Items Addressed This Visit    Elevated hemoglobin A1c    Concern newly identified elevated A1c 5.9, no prior comparison Concern with HLD  Plan:  1. Not on any therapy currently  2. Encourage improved lifestyle - low carb, low sugar diet, reduce portion size, continue improving regular exercise 3. Follow-up 6 months PreDM A1c re-check trend      Elevated LFTs    Resolved Normalized LFTs on re-check, last elevated 08/2017 Has hyperTG and elevated A1c Will improve lifestyle diet/exercise Follow-up labs again in 6 months      Gastroesophageal reflux disease    Dramatically improved GERD, secondary to H Pylori positive and lifestyle Now s/p treatment H Pylori, and continued PPI x 3 weeks dramatic improvement  Plan: 1. See A&P for H Pylori with dosing/timing of remaining PPI treatment and repeat test of cure H Pylori Breath test 2. For now continue Omeprazole 20mg  daily for 1 more week - finish 4 week course, then off for 2 weeks for breath test, then in future may use 20mg  daily for 2-4 week course PRN if need or we may determine he might benefit from longer course of therapy - reviewed benefits, risk 3. Continue improving dietary changes and lifestyle improvement 4. Strict return criteria given for worsening symptoms 5. Follow-up within 6 months for routine f/u - advised return sooner within few weeks to few  months if refractory symptoms      H. pylori infection    S/p treatment triple therapy - PCN allergy w/ Metronidazole + Clarithromycin and PPI BID for 2 weeks, from 10/24/17 through 11/07/17 - Clinically improved - Finish 4 weeks total PPI, last dose 11/21/17 - Remain OFF PPI and other medicines as counseled for 2 weeks - Next test repeat H Pylori breath test for test of cure AFTER 12/05/17 - within 1-2 weeks - if negative, then may take PPI again PRN dosing for future, if positive may require recurrent treatment course and consider referral to GI  For future, if GERD is resolved - but recurs we can reconsider repeat H Pylori testing again.      Mixed hyperlipidemia    Uncontrolled cholesterol improving lifestyle Last lipid panel 10/2017 showed low HDL, elevated LDL, and high TG >400 - likely secondary to hyperglycemia Calculated ASCVD 10 yr risk score 2.7% = low risk  Plan: 1. Encourage improved lifestyle - low carb/cholesterol, reduce portion size, continue improving regular exercise Follow-up 6 months repeat Lipid panel fasting for trend - focus on lifestyle improvement as he is already demonstrated       Sickle cell trait (HCC)    Asymptomatic currently Based on history No documented definitive test available at this time Lab shows normal Hgb and other CBC markers Reassurance, follow-up       Other Visit Diagnoses  Annual physical exam    -  Primary    Updated Health Maintenance information Reviewed recent lab results with patient Encouraged improvement to lifestyle with diet and exercise   No orders of the defined types were placed in this encounter.   Follow up plan: Return in about 6 months (around 05/17/2018) for Lab results - Elevated A1c (lab result, do not need POC check), HLD.  Future H Pylori Breath test ordered for after 12/05/17.  Will place future orders for labs A1c, CMET, and Lipid fasting for 6 months from now once completed breath test.  Saralyn Pilar, DO [facility] 11/14/2017, 5:55 PM

## 2017-11-14 NOTE — Assessment & Plan Note (Signed)
Uncontrolled cholesterol improving lifestyle Last lipid panel 10/2017 showed low HDL, elevated LDL, and high TG >400 - likely secondary to hyperglycemia Calculated ASCVD 10 yr risk score 2.7% = low risk  Plan: 1. Encourage improved lifestyle - low carb/cholesterol, reduce portion size, continue improving regular exercise Follow-up 6 months repeat Lipid panel fasting for trend - focus on lifestyle improvement as he is already demonstrated

## 2017-11-14 NOTE — Assessment & Plan Note (Signed)
Dramatically improved GERD, secondary to H Pylori positive and lifestyle Now s/p treatment H Pylori, and continued PPI x 3 weeks dramatic improvement  Plan: 1. See A&P for H Pylori with dosing/timing of remaining PPI treatment and repeat test of cure H Pylori Breath test 2. For now continue Omeprazole 20mg  daily for 1 more week - finish 4 week course, then off for 2 weeks for breath test, then in future may use 20mg  daily for 2-4 week course PRN if need or we may determine he might benefit from longer course of therapy - reviewed benefits, risk 3. Continue improving dietary changes and lifestyle improvement 4. Strict return criteria given for worsening symptoms 5. Follow-up within 6 months for routine f/u - advised return sooner within few weeks to few months if refractory symptoms

## 2017-11-14 NOTE — Patient Instructions (Addendum)
Thank you for coming to the office today.  Continuing from today 7/24 - continue to take one pill Omeprazole 20mg  daily before breakfast as you are for the next ONE WEEK.  Last dose of Omeprazole 20mg  = Wednesday 11/21/17  REMAIN OFF Omeprazole (and do not take any Prilosec, nexium, zantac, ranitidine, pepcid - none of these) for the next 2 weeks  May take TUMS if absolutely needed.  NEXT Breath Test would be on or after Wednesday 12/05/17 - LAB ONLY do not need to see provider  -----------------------------  If in future stomach acid reflux comes back - notify office and we can restart the Omeprazole - you do have some refills at pharmacy if you need, may need to take for 4 to 8 weeks again in future as needed.  We would NOT repeat H Pylori if it is negative, unless a few years have passed.  -----------------------------------------  1. Chemistry - Normal results, including electrolytes, kidney and liver function. Normal fasting blood sugar   2. Hemoglobin A1c (Diabetes screening) - 5.9, elevated, no result for comparison, it is in range of Pre-Diabetes (>5.7 to 6.4)   3. Routine screening HIV - Negative.  4. Cholesterol - Abnormal cholesterol results. Low 29 HDL (good cholesterol), Calculated LDL 161 (bad cholesterol), and Significantly elevated 426 Triglycerides - likely due to some hyperglycemia. Based off of cardiovascular risk score calculation, you do not meet criteria to start a Statin Cholesterol med.  5. CBC Blood Counts - Normal, no anemia, other abnormality   DUE for FASTING BLOOD WORK (no food or drink after midnight before the lab appointment, only water or coffee without cream/sugar on the morning of)  SCHEDULE "Lab Only" visit in the morning at the clinic for lab draw in 6 MONTHS   - Make sure Lab Only appointment is at about 1 week before your next appointment, so that results will be available  For Lab Results, once available within 2-3 days of blood draw, you can  can log in to MyChart online to view your results and a brief explanation. Also, we can discuss results at next follow-up visit.   Please schedule a Follow-up Appointment to: Return in about 6 months (around 05/17/2018) for Lab results - Elevated A1c (lab result, do not need POC check), HLD.  If you have any other questions or concerns, please feel free to call the office or send a message through MyChart. You may also schedule an earlier appointment if necessary.  Additionally, you may be receiving a survey about your experience at our office within a few days to 1 week by e-mail or mail. We value your feedback.  Saralyn PilarAlexander Karamalegos, DO [facility]

## 2017-11-14 NOTE — Assessment & Plan Note (Signed)
S/p treatment triple therapy - PCN allergy w/ Metronidazole + Clarithromycin and PPI BID for 2 weeks, from 10/24/17 through 11/07/17 - Clinically improved - Finish 4 weeks total PPI, last dose 11/21/17 - Remain OFF PPI and other medicines as counseled for 2 weeks - Next test repeat H Pylori breath test for test of cure AFTER 12/05/17 - within 1-2 weeks - if negative, then may take PPI again PRN dosing for future, if positive may require recurrent treatment course and consider referral to GI  For future, if GERD is resolved - but recurs we can reconsider repeat H Pylori testing again.

## 2017-12-05 ENCOUNTER — Telehealth: Payer: Self-pay | Admitting: Family Medicine

## 2017-12-05 ENCOUNTER — Other Ambulatory Visit: Payer: Managed Care, Other (non HMO)

## 2017-12-05 DIAGNOSIS — Z9852 Vasectomy status: Secondary | ICD-10-CM

## 2017-12-05 NOTE — Telephone Encounter (Signed)
Please call patient to remind him that he needs to schedule LAB ONLY - he is due anytime now in next 1-2 weeks for LAB ONLY - H Pylori repeat Breath Test.  He should be OFF of Omeprazole 20mg  daily for at least 2 weeks before he has this test done. He should be fasting as well for this breath test.  He should have stopped Omeprazole on 11/21/17.  Saralyn PilarAlexander Karamalegos, DO [facility] 12/05/2017, 5:42 PM

## 2017-12-06 LAB — POST-VAS SPERM EVALUATION,QUAL: SEMEN VOLUME: 4.4 mL

## 2017-12-06 NOTE — Telephone Encounter (Signed)
Left message for patient to call back  

## 2017-12-11 ENCOUNTER — Telehealth: Payer: Self-pay

## 2017-12-11 NOTE — Telephone Encounter (Signed)
-----   Message from Riki AltesScott C Stoioff, MD sent at 12/09/2017  9:22 AM EDT ----- Semen analysis does show persistent sperm present.  Continue alternate contraception.  Need to schedule a formal semen analysis to evaluate for sperm motility and number.

## 2017-12-11 NOTE — Telephone Encounter (Signed)
Attempted to contact the pt, no answer. LMOM to return my call.  

## 2017-12-11 NOTE — Telephone Encounter (Signed)
Called pt no answer, LM for pt informing him of the need for formal semen analysis. Advised pt to come by office to pick up items needed.

## 2017-12-12 NOTE — Telephone Encounter (Signed)
Patient still unable to be reached for scheduling.  Here was copy of my plan that we recommended:  After H pylori breath test (repeat) - If negative may take Omeprazole PRN   6 months  - Fasting lab only - CMET, A1c, Lipid - lifestyle changes  - 1 week later 6 mo follow-up apt  Saralyn PilarAlexander Karamalegos, DO [facility] 12/12/2017, 5:27 PM

## 2017-12-13 NOTE — Telephone Encounter (Signed)
Called several time unable to reach patient letter has been mailed.

## 2017-12-27 ENCOUNTER — Telehealth: Payer: Self-pay

## 2017-12-27 ENCOUNTER — Other Ambulatory Visit: Payer: Managed Care, Other (non HMO)

## 2017-12-27 DIAGNOSIS — A048 Other specified bacterial intestinal infections: Secondary | ICD-10-CM

## 2017-12-27 DIAGNOSIS — K219 Gastro-esophageal reflux disease without esophagitis: Secondary | ICD-10-CM

## 2017-12-27 NOTE — Telephone Encounter (Signed)
Pt walked into the clinic today requesting lab orders for post vasectomy semen formal evaluation. [facility] form was filled out, per [place], code to use is [phone]. Pt was given information and instructed how to do this. He was provided with a sterile cup. Pt expressed understanding.

## 2017-12-28 LAB — H. PYLORI BREATH TEST: H. pylori Breath Test: DETECTED — AB

## 2017-12-31 ENCOUNTER — Ambulatory Visit (INDEPENDENT_AMBULATORY_CARE_PROVIDER_SITE_OTHER): Payer: Managed Care, Other (non HMO) | Admitting: Family Medicine

## 2017-12-31 ENCOUNTER — Encounter: Payer: Self-pay | Admitting: Family Medicine

## 2017-12-31 VITALS — BP 123/67 | HR 71 | Temp 97.8°F | Resp 16 | Ht 69.0 in | Wt 205.0 lb

## 2017-12-31 DIAGNOSIS — K219 Gastro-esophageal reflux disease without esophagitis: Secondary | ICD-10-CM

## 2017-12-31 DIAGNOSIS — A048 Other specified bacterial intestinal infections: Secondary | ICD-10-CM

## 2017-12-31 MED ORDER — OMEPRAZOLE 20 MG PO CPDR: 20.0000 mg | DELAYED_RELEASE_CAPSULE | Freq: Two times a day (BID) | ORAL | 0 refills | Status: DC

## 2017-12-31 MED ORDER — METRONIDAZOLE 500 MG PO TABS: 500.0000 mg | ORAL_TABLET | Freq: Three times a day (TID) | ORAL | 0 refills | Status: AC

## 2017-12-31 MED ORDER — LEVOFLOXACIN 500 MG PO TABS: 500.0000 mg | ORAL_TABLET | Freq: Every day | ORAL | 0 refills | Status: AC

## 2017-12-31 NOTE — Progress Notes (Signed)
Subjective:    Patient ID: Robert Smith, male    DOB: Jun 25, 1975, 42 y.o.   MRN: [id number]  Robert Smith is a 42 y.o. male presenting on 12/31/2017 for H pylori infection   HPI   FOLLOW-UP GERD / positive H Pylori Infection Last visit 11/14/17 for same issue, he had initial H Pylori breath test positive 10/2017, he dx with GERD and treated with triple therapy with antibiotics (w/ PCN allergy) on Metronidazole and Clarithromycin, then tapered off PPI BID to DAILY then eventually OFF prior to repeat breath test. - Interval update - repeat test of cure H Pylori Breath test done on 12/27/17, showed POSITIVE again. However patient has remained asymptomatic from GERD standpoint - Today he is doing well, again remains asymptomatic. He is OFF PPI Omeprazole 20mg  at this time. - He is here to discuss repeat antibiotic treatment course for H Pylori. He would prefer antibiotics again instead of referral to GI at this time. No prior EGD or biopsy before. - he has improved diet, limiting fried fatty greasy spicy trigger foods Denies abdominal pain, nausea vomiting, dark stool or blood in stool, fever chills, unintentional wt loss  Additionally at end of visit - he reports some mid to low back pain, attributed to working long shift standing on feet, no new injury, he plans to seek care at a chiropractor, declines further intervention today.  Health Maintenance: Due for Flu Shot, declines today despite counseling on benefits  Depression screen [facility] 2/9 12/31/2017 10/22/2017  Decreased Interest 0 0  Down, Depressed, Hopeless 0 0  PHQ - 2 Score 0 0    Social History   Tobacco Use  . Smoking status: Former Games developer  . Smokeless tobacco: Former Engineer, water Use Topics  . Alcohol use: No  . Drug use: Never    Review of Systems Per HPI unless specifically indicated above     Objective:    BP 123/67   Pulse 71   Temp 97.8 F (36.6 C) (Oral)   Resp 16   Ht 5\' 9"  (1.753 m)   Wt 205 lb (93 kg)   BMI  30.27 kg/m   Wt Readings from Last 3 Encounters:  12/31/17 205 lb (93 kg)  11/14/17 203 lb 9.6 oz (92.4 kg)  10/22/17 209 lb (94.8 kg)    Physical Exam  Constitutional: He is oriented to person, place, and time. He appears well-developed and well-nourished. No distress.  Well-appearing, comfortable, cooperative  HENT:  Head: Normocephalic and atraumatic.  Mouth/Throat: Oropharynx is clear and moist.  Eyes: Conjunctivae are normal. Right eye exhibits no discharge. Left eye exhibits no discharge.  Cardiovascular: Normal rate.  Pulmonary/Chest: Effort normal.  Abdominal: He exhibits no distension. There is no tenderness.  Musculoskeletal: He exhibits no edema.  Neurological: He is alert and oriented to person, place, and time.  Skin: Skin is warm and dry. No rash noted. He is not diaphoretic. No erythema.  Psychiatric: He has a normal mood and affect. His behavior is normal.  Well groomed, good eye contact, normal speech and thoughts  Nursing note and vitals reviewed.  Results for orders placed or performed in visit on 12/27/17  H. pylori breath test  Result Value Ref Range   H. pylori Breath Test DETECTED (A) NOT DETECT      Assessment & Plan:   Problem List Items Addressed This Visit    Gastroesophageal reflux disease    Remains improved GERD, virtually resolved - now off PPI S/p H  Pylori triple therapy treatment, now repeat test of cure still POSITIVE See A&P H Pylori  Plan: 1. See A&P for H Pylori with dosing/timing of REPEAT TRIPLE THERAPY and PPI treatment and repeat test of cure H Pylori Breath test - #2 in 2 months - Omeprazole 20 BID for 2 weeks then daily for 4 weeks then STOP remain off PPI 2 week before breath test 2. Continue improving dietary changes and lifestyle improvement 3. Strict return criteria given for worsening symptoms 4. Follow-up as needed - if failure of 2nd treatment or recurrence of symptoms GERD - will refer to GI      Relevant Medications    omeprazole (PRILOSEC) 20 MG capsule   H. pylori infection - Primary    Repeat H Pylori breath test POSITIVE, s/p initial treatment Tripe Therapy PCN Allergy (7/3 to 7/17)  Plan - Offered referral to GI but we mutually agree - will proceed with repeat antibiotic trial first  Repeat Triple Therapy with different antibiotic following algorithm for possible antibiotic resistance - Switch Clarithromycin now for Levaquin 500mg  daily x 14 days - REPEAT Metronidazole 500mg  TID x 14 days - Omeprazole 20mg  BID x 14 days - Then continue PPI daily for 4 weeks - STOP PPI for 2 weeks - Repeat H Pylori Breath test  If not improving or repeat breat test STILL Positive then will refer to GI for more definitive H Pylori testing likely EGD      Relevant Medications   omeprazole (PRILOSEC) 20 MG capsule   levofloxacin (LEVAQUIN) 500 MG tablet   metroNIDAZOLE (FLAGYL) 500 MG tablet      Additional note - he has requested testing for testosterone / low energy in future, once H Pylori is treated we will return to discuss these tests.   Meds ordered this encounter  Medications  . omeprazole (PRILOSEC) 20 MG capsule    Sig: Take 1 capsule (20 mg total) by mouth 2 (two) times daily before a meal for 14 days.    Dispense:  28 capsule    Refill:  0  . levofloxacin (LEVAQUIN) 500 MG tablet    Sig: Take 1 tablet (500 mg total) by mouth daily for 14 days.    Dispense:  14 tablet    Refill:  0  . metroNIDAZOLE (FLAGYL) 500 MG tablet    Sig: Take 1 tablet (500 mg total) by mouth 3 (three) times daily for 14 days. Do not take medicine with Alcohol    Dispense:  42 tablet    Refill:  0    Follow up plan: Return in about 2 months (around 03/02/2018) for Lab only H Pylori Breath Test.  Future lab for H Pylori Breath Test ordered for about 2 months, should be off Omeprazole 20mg  daily PPI for 2 weeks prior to check test.  Saralyn Pilar, DO [facility] 12/31/2017, 12:53 PM

## 2017-12-31 NOTE — Patient Instructions (Addendum)
Thank you for coming to the office today.  For next 14 days we need to treat H Pylori bacteria again  - take Omeprazole 20mg  - TWICE daily before meal for 14 days - then once bottle runs out start NEW rx sent over for Omeprazole 20mg  DAILY before breakfast, once daily for next 4-6 weeks, then can taper off or stop.  After finish new rx Omeprazole - then STOP COMPLETELY for 2 weeks, before scheduling to come in for repeat H Pylor Breath Test  - Take Levaquin antibiotic 500mg  daily for 14 days - Take Metronidazole antibiotic 500mg  3 times daily for 14 days - do not drink alcohol on this med  Call us in 2020 at some point to request blood work for Testosterone, Thyroid and low energy - in the meantime you can try any [employer] Supplement if you prefer natural OTC.  Recommend to start taking Tylenol Extra Strength 500mg  tabs - take 1 to 2 tabs per dose (max 1000mg ) every 6-8 hours for pain (take regularly, don't skip a dose for next 7 days), max 24 hour daily dose is 6 tablets or 3000mg . In the future you can repeat the same everyday Tylenol course for 1-2 weeks at a time.   May go to Chiropractor for back  Use heating pad as needed   Please schedule a Follow-up Appointment to: Return in about 2 months (around 03/02/2018) for Lab only H Pylori Breath Test.  If you have any other questions or concerns, please feel free to call the office or send a message through MyChart. You may also schedule an earlier appointment if necessary.  Additionally, you may be receiving a survey about your experience at our office within a few days to 1 week by e-mail or mail. We value your feedback.  Saralyn Pilar, DO [facility] [place], [place]

## 2017-12-31 NOTE — Assessment & Plan Note (Addendum)
Remains improved GERD, virtually resolved - now off PPI S/p H Pylori triple therapy treatment, now repeat test of cure still POSITIVE See A&P H Pylori  Plan: 1. See A&P for H Pylori with dosing/timing of REPEAT TRIPLE THERAPY and PPI treatment and repeat test of cure H Pylori Breath test - #2 in 2 months - Omeprazole 20 BID for 2 weeks then daily for 4 weeks then STOP remain off PPI 2 week before breath test 2. Continue improving dietary changes and lifestyle improvement 3. Strict return criteria given for worsening symptoms 4. Follow-up as needed - if failure of 2nd treatment or recurrence of symptoms GERD - will refer to GI

## 2017-12-31 NOTE — Assessment & Plan Note (Signed)
Repeat H Pylori breath test POSITIVE, s/p initial treatment Tripe Therapy PCN Allergy (7/3 to 7/17)  Plan - Offered referral to GI but we mutually agree - will proceed with repeat antibiotic trial first  Repeat Triple Therapy with different antibiotic following algorithm for possible antibiotic resistance - Switch Clarithromycin now for Levaquin 500mg  daily x 14 days - REPEAT Metronidazole 500mg  TID x 14 days - Omeprazole 20mg  BID x 14 days - Then continue PPI daily for 4 weeks - STOP PPI for 2 weeks - Repeat H Pylori Breath test  If not improving or repeat breat test STILL Positive then will refer to GI for more definitive H Pylori testing likely EGD

## 2018-01-03 ENCOUNTER — Other Ambulatory Visit: Payer: Self-pay | Admitting: Family Medicine

## 2018-01-03 DIAGNOSIS — K219 Gastro-esophageal reflux disease without esophagitis: Secondary | ICD-10-CM

## 2018-01-03 DIAGNOSIS — A048 Other specified bacterial intestinal infections: Secondary | ICD-10-CM

## 2018-01-03 MED ORDER — OMEPRAZOLE 20 MG PO CPDR: 20.0000 mg | DELAYED_RELEASE_CAPSULE | Freq: Every day | ORAL | 0 refills | Status: DC

## 2018-05-01 ENCOUNTER — Other Ambulatory Visit: Payer: Self-pay | Admitting: Internal Medicine

## 2018-05-02 LAB — SEMEN ANALYSIS, BASIC
Appearance: NORMAL
Concentration, Sperm: NONE SEEN x10E6/mL (ref >14.9)
Immotile Sperm: 100 %
Leukocyte Concentration: >1 x10E6/mL — ABNORMAL HIGH (ref <1.00)
Time Collected: 1100
Time Received: 1115
Time Since Last Emission: 1 days
Volume: 2.2 mL (ref >1.4)
pH: 8 (ref >7.1)

## 2018-05-16 ENCOUNTER — Telehealth: Payer: Self-pay

## 2018-05-16 NOTE — Telephone Encounter (Signed)
Per Dr. Lonna Cobb patient was notified that post vas semen analysis was not conclusive and would need another repeat done in one month. Patient understands and will pick up cup today and make lab appointment

## 2019-02-24 ENCOUNTER — Ambulatory Visit
Admission: EM | Admit: 2019-02-24 | Discharge: 2019-02-24 | Disposition: A | Discharge status: Home / Self Care | Payer: Managed Care, Other (non HMO) | Attending: Family Medicine | Admitting: Family Medicine

## 2019-02-24 ENCOUNTER — Telehealth: Payer: Self-pay

## 2019-02-24 ENCOUNTER — Other Ambulatory Visit: Payer: Self-pay

## 2019-02-24 DIAGNOSIS — K121 Other forms of stomatitis: Secondary | ICD-10-CM

## 2019-02-24 MED ORDER — PREDNISONE 10 MG PO TABS: ORAL_TABLET | ORAL | 0 refills | Status: DC

## 2019-02-24 NOTE — ED Provider Notes (Signed)
[facility]    CSN: [id number] Arrival date & time: 02/24/19  1156      History   Chief Complaint Chief Complaint  Patient presents with  . Mouth Lesions    HPI Robert Smith is a 43 y.o. male.   43 yo male with a c/o sore on his inner lips and mouth after eating a star fruit yesterday. Denies any swelling, wheezing, difficulty breathing or swallowing.  States sores are painful. Wondering if his H.pylori past diagnosis has anything to do with this.    Mouth Lesions   Past Medical History:  Diagnosis Date  . Dengue fever     Patient Active Problem List   Diagnosis Date Noted  . H. pylori infection 11/14/2017  . Elevated hemoglobin A1c 11/08/2017  . Mixed hyperlipidemia 11/08/2017  . Elevated LFTs 10/22/2017  . Gastroesophageal reflux disease 10/22/2017  . Sickle cell trait ([place]) 10/22/2017    Past Surgical History:  Procedure Laterality Date  . IMPACTED THIRD MOLAR REMOVAL    . VASECTOMY  08/17/2017   [facility] Dr Bernardo Heater       Home Medications    Prior to Admission medications   Medication Sig Start Date End Date Taking? Authorizing Provider  mupirocin ointment (BACTROBAN) 2 % Apply 1 application topically 2 (two) times daily as needed. As needed for 1 week. Apply to back as needed spots only for skin infection 10/22/17   Parks Ranger, Devonne Doughty, DO  omeprazole (PRILOSEC) 20 MG capsule Take 1 capsule (20 mg total) by mouth daily before breakfast. 01/03/18 02/02/18  Karamalegos, Devonne Doughty, DO  predniSONE (DELTASONE) 10 MG tablet Start 60 mg po day one, then 50 mg po day two, taper by 10 mg daily until complete. 02/24/19   Norval Gable, MD    Family History Family History  Problem Relation Age of Onset  . Sickle cell trait Son   . Sickle cell anemia Son   . Hypertension Mother   . Hyperlipidemia Mother   . Heart disease Father   . Heart attack Father   . Prostate cancer Paternal Grandfather 79       dx age 33  . Prostate cancer Paternal Uncle    . Diabetes Maternal Grandmother   . Brain cancer Other        Glioblastoma Multiforme  . Bladder Cancer Neg Hx   . Kidney cancer Neg Hx     Social History Social History   Tobacco Use  . Smoking status: Former Research scientist (life sciences)  . Smokeless tobacco: Former Network engineer Use Topics  . Alcohol use: No  . Drug use: Never     Allergies   Aspirin and Penicillins   Review of Systems Review of Systems  HENT: Positive for mouth sores.      Physical Exam Triage Vital Signs ED Triage Vitals  Enc Vitals Group     BP 02/24/19 1207 (!) 147/99     Pulse Rate 02/24/19 1207 79     Resp 02/24/19 1207 16     Temp 02/24/19 1207 97.9 F (36.6 C)     Temp Source 02/24/19 1207 Oral     SpO2 02/24/19 1207 100 %     Weight 02/24/19 1204 210 lb (95.3 kg)     Height 02/24/19 1204 5\' 9"  (1.753 m)     Head Circumference --      Peak Flow --      Pain Score 02/24/19 1204 10     Pain Loc --  Pain Edu? --      Excl. in [place]? --    No data found.  Updated Vital Signs BP (!) 147/99 (BP Location: Left Arm)   Pulse 79   Temp 97.9 F (36.6 C) (Oral)   Resp 16   Ht 5\' 9"  (1.753 m)   Wt 95.3 kg   SpO2 100%   BMI 31.01 kg/m   Visual Acuity Right Eye Distance:   Left Eye Distance:   Bilateral Distance:    Right Eye Near:   Left Eye Near:    Bilateral Near:     Physical Exam Vitals signs and nursing note reviewed.  Constitutional:      General: He is not in acute distress.    Appearance: He is not toxic-appearing or diaphoretic.  HENT:     Mouth/Throat:     Mouth: Mucous membranes are moist. Oral lesions (multiple ulcerative lesions inside the lips and cheeks) present.     Tongue: No lesions.     Pharynx: Oropharynx is clear. Uvula midline. No oropharyngeal exudate or uvula swelling.     Tonsils: No tonsillar exudate.  Neurological:     Mental Status: He is alert.      UC Treatments / Results  Labs (all labs ordered are listed, but only abnormal results are displayed)  Labs Reviewed - No data to display  EKG   Radiology No results found.  Procedures Procedures (including critical care time)  Medications Ordered in UC Medications - No data to display  Initial Impression / Assessment and Plan / UC Course  I have reviewed the triage vital signs and the nursing notes.  Pertinent labs & imaging results that were available during my care of the patient were reviewed by me and considered in my medical decision making (see chart for details).      Final Clinical Impressions(s) / UC Diagnoses   Final diagnoses:  Contact stomatitis     Discharge Instructions     Over the counter allergy medications daily Avoid triggering food Frequent mouthwash rinses to prevent secondary bacterial infection    ED Prescriptions    Medication Sig Dispense Auth. Provider   predniSONE (DELTASONE) 10 MG tablet Start 60 mg po day one, then 50 mg po day two, taper by 10 mg daily until complete. 21 tablet Norval Gable, MD      1. diagnosis reviewed with patient 2. rx as per orders above; reviewed possible side effects, interactions, risks and benefits  3. Recommend supportive treatment as above 4. Follow-up prn if symptoms worsen or don't improve  PDMP not reviewed this encounter.   Norval Gable, MD 02/24/19 1327

## 2019-02-24 NOTE — ED Triage Notes (Signed)
Patient states that he ate star fruit last night and woke up this morning with mouth sores on lips and mouth. Patient is concerned that he may have had an allergic reaction. States that he was diagnosed with H. Pylori last year and was concerned this was attributing to this.

## 2019-02-24 NOTE — Discharge Instructions (Signed)
Over the counter allergy medications daily Avoid triggering food Frequent mouthwash rinses to prevent secondary bacterial infection

## 2019-02-24 NOTE — Telephone Encounter (Signed)
The pt called complaining of sores in the inside of his mouth and down his throat. He complains of irritated throat with some mild discomfort when he swallows. The patient state he ate a star fruit last night and he think the citruis might have cause the reaction. I informed the patient that Dr. Raliegh Ip didn't have any available appointment today. I recommended that he go and be seen at a Urgent Care. He verbalize understanding. I also gave him the information for [facility], because he was concern about going somewhere that was not able to access his records.

## 2019-03-04 ENCOUNTER — Ambulatory Visit
Admission: EM | Admit: 2019-03-04 | Discharge: 2019-03-04 | Disposition: A | Discharge status: Home / Self Care | Payer: Managed Care, Other (non HMO) | Attending: Emergency Medicine | Admitting: Emergency Medicine

## 2019-03-04 ENCOUNTER — Encounter: Payer: Self-pay | Admitting: Emergency Medicine

## 2019-03-04 ENCOUNTER — Other Ambulatory Visit: Payer: Self-pay

## 2019-03-04 DIAGNOSIS — R519 Headache, unspecified: Secondary | ICD-10-CM | POA: Diagnosis not present

## 2019-03-04 DIAGNOSIS — R030 Elevated blood-pressure reading, without diagnosis of hypertension: Secondary | ICD-10-CM

## 2019-03-04 NOTE — ED Provider Notes (Signed)
Robert Smith    CSN: [id number] Arrival date & time: 03/04/19  1435      History   Chief Complaint Chief Complaint  Patient presents with  . Headache  . covid exposure    HPI Robert Smith is a 43 y.o. male.   Patient presents with a headache x3 days.  He reports it is mild and generalized.  He has taken ibuprofen for this without relief.  He is here today to request a COVID test because he was exposed to a COVID positive person 6 days ago.  He denies fever, chills, cough, shortness of breath, vomiting, diarrhea, rash, dizziness, changes in his vision, or other symptoms.  The history is provided by the patient.    Past Medical History:  Diagnosis Date  . Dengue fever     Patient Active Problem List   Diagnosis Date Noted  . H. pylori infection 11/14/2017  . Elevated hemoglobin A1c 11/08/2017  . Mixed hyperlipidemia 11/08/2017  . Elevated LFTs 10/22/2017  . Gastroesophageal reflux disease 10/22/2017  . Sickle cell trait ([place]) 10/22/2017    Past Surgical History:  Procedure Laterality Date  . IMPACTED THIRD MOLAR REMOVAL    . VASECTOMY  08/17/2017   [facility] Dr Bernardo Heater       Home Medications    Prior to Admission medications   Medication Sig Start Date End Date Taking? Authorizing Provider  omeprazole (PRILOSEC) 20 MG capsule Take 1 capsule (20 mg total) by mouth daily before breakfast. 01/03/18 03/04/19 Yes Karamalegos, Devonne Doughty, DO  predniSONE (DELTASONE) 10 MG tablet Start 60 mg po day one, then 50 mg po day two, taper by 10 mg daily until complete. 02/24/19  Yes Norval Gable, MD  mupirocin ointment (BACTROBAN) 2 % Apply 1 application topically 2 (two) times daily as needed. As needed for 1 week. Apply to back as needed spots only for skin infection 10/22/17   Olin Hauser, DO    Family History Family History  Problem Relation Age of Onset  . Sickle cell trait Son   . Sickle cell anemia Son   . Hypertension Mother   . Hyperlipidemia Mother    . Heart disease Father   . Heart attack Father   . Prostate cancer Paternal Grandfather 12       dx age 70  . Prostate cancer Paternal Uncle   . Diabetes Maternal Grandmother   . Brain cancer Other        Glioblastoma Multiforme  . Bladder Cancer Neg Hx   . Kidney cancer Neg Hx     Social History Social History   Tobacco Use  . Smoking status: Former Smoker    Quit date: 03/04/1999    Years since quitting: 20.0  . Smokeless tobacco: Never Used  Substance Use Topics  . Alcohol use: No  . Drug use: Never     Allergies   Aspirin and Penicillins   Review of Systems Review of Systems  Constitutional: Negative for chills and fever.  HENT: Negative for congestion, ear pain, rhinorrhea and sore throat.   Eyes: Negative for pain and visual disturbance.  Respiratory: Negative for cough and shortness of breath.   Cardiovascular: Negative for chest pain and palpitations.  Gastrointestinal: Negative for abdominal pain, diarrhea, nausea and vomiting.  Genitourinary: Negative for dysuria and hematuria.  Musculoskeletal: Negative for arthralgias and back pain.  Skin: Negative for color change and rash.  Neurological: Positive for headaches. Negative for dizziness, tremors, seizures, syncope, facial asymmetry, speech  difficulty, weakness, light-headedness and numbness.  All other systems reviewed and are negative.    Physical Exam Triage Vital Signs ED Triage Vitals  Enc Vitals Group     BP      Pulse      Resp      Temp      Temp src      SpO2      Weight      Height      Head Circumference      Peak Flow      Pain Score      Pain Loc      Pain Edu?      Excl. in [place]?    No data found.  Updated Vital Signs BP (!) 148/94 (BP Location: Left Arm)   Pulse 79   Temp 98.3 F (36.8 C) (Oral)   Resp 18   Ht 5\' 9"  (1.753 m)   Wt 220 lb (99.8 kg)   SpO2 96%   BMI 32.49 kg/m   Visual Acuity Right Eye Distance:   Left Eye Distance:   Bilateral Distance:     Right Eye Near:   Left Eye Near:    Bilateral Near:     Physical Exam Vitals signs and nursing note reviewed.  Constitutional:      General: He is not in acute distress.    Appearance: He is well-developed. He is not ill-appearing.  HENT:     Head: Normocephalic and atraumatic.     Right Ear: Tympanic membrane normal.     Left Ear: Tympanic membrane normal.     Nose: Nose normal.     Mouth/Throat:     Mouth: Mucous membranes are moist.     Pharynx: Oropharynx is clear.  Eyes:     Conjunctiva/sclera: Conjunctivae normal.  Neck:     Musculoskeletal: Neck supple.  Cardiovascular:     Rate and Rhythm: Normal rate and regular rhythm.     Heart sounds: No murmur.  Pulmonary:     Effort: Pulmonary effort is normal. No respiratory distress.     Breath sounds: Normal breath sounds.  Abdominal:     General: Bowel sounds are normal.     Palpations: Abdomen is soft.     Tenderness: There is no abdominal tenderness. There is no guarding or rebound.  Skin:    General: Skin is warm and dry.     Findings: No rash.  Neurological:     General: No focal deficit present.     Mental Status: He is alert and oriented to person, place, and time.      UC Treatments / Results  Labs (all labs ordered are listed, but only abnormal results are displayed) Labs Reviewed  SARS CORONAVIRUS 2 (TAT 6-24 HRS)    EKG   Radiology No results found.  Procedures Procedures (including critical care time)  Medications Ordered in UC Medications - No data to display  Initial Impression / Assessment and Plan / UC Course  I have reviewed the triage vital signs and the nursing notes.  Pertinent labs & imaging results that were available during my care of the patient were reviewed by me and considered in my medical decision making (see chart for details).    Acute non-intractable headache, mild.  Elevated blood pressure.  COVID test performed here.  Instructed patient to self quarantine until the  test result is back.  Instructed patient to go to the emergency department if he develops high fever, shortness  of breath, severe diarrhea, or other concerning symptoms.  Discussed with patient that his blood pressure was elevated today needs to be rechecked by his PCP in the next 2 to 4 weeks.  Patient agrees with plan of care.   Final Clinical Impressions(s) / UC Diagnoses   Final diagnoses:  Acute nonintractable headache, unspecified headache type  Elevated blood pressure reading     Discharge Instructions     Your COVID test is pending.  You should self quarantine until your test result is back and is negative.    Go to the emergency department if you develop high fever, shortness of breath, severe diarrhea, or other concerning symptoms.    Your blood pressure is elevated today at 148/94.  Please have this rechecked by your primary care provider in 2 weeks.      ED Prescriptions    None     PDMP not reviewed this encounter.   Sharion Balloon, NP 03/04/19 [phone]

## 2019-03-04 NOTE — Discharge Instructions (Addendum)
Your COVID test is pending.  You should self quarantine until your test result is back and is negative.    Go to the emergency department if you develop high fever, shortness of breath, severe diarrhea, or other concerning symptoms.    Your blood pressure is elevated today at 148/94.  Please have this rechecked by your primary care provider in 2 weeks.

## 2019-03-04 NOTE — ED Triage Notes (Signed)
Patient in today c/o headache x 3 days. Patient states he has had an exposure of a Covid+ patient. Patient has tried OTC Ibuprofen without relief.

## 2019-03-06 LAB — NOVEL CORONAVIRUS, NAA: SARS-CoV-2, NAA: NOT DETECTED

## 2019-03-07 ENCOUNTER — Telehealth: Payer: Self-pay | Admitting: *Deleted

## 2019-04-28 ENCOUNTER — Other Ambulatory Visit: Payer: Self-pay

## 2019-04-28 ENCOUNTER — Encounter: Payer: Self-pay | Admitting: Emergency Medicine

## 2019-04-28 ENCOUNTER — Ambulatory Visit
Admission: EM | Admit: 2019-04-28 | Discharge: 2019-04-28 | Disposition: A | Discharge status: Home / Self Care | Payer: Managed Care, Other (non HMO) | Attending: Emergency Medicine | Admitting: Emergency Medicine

## 2019-04-28 DIAGNOSIS — U071 COVID-19: Secondary | ICD-10-CM | POA: Diagnosis not present

## 2019-04-28 DIAGNOSIS — R509 Fever, unspecified: Secondary | ICD-10-CM

## 2019-04-28 DIAGNOSIS — R05 Cough: Secondary | ICD-10-CM | POA: Diagnosis not present

## 2019-04-28 DIAGNOSIS — R519 Headache, unspecified: Secondary | ICD-10-CM

## 2019-04-28 LAB — POC SARS CORONAVIRUS 2 AG -  ED: SARS Coronavirus 2 Ag: POSITIVE — AB

## 2019-04-28 MED ORDER — ONDANSETRON 4 MG PO TBDP
4.0000 mg | ORAL_TABLET | Freq: Once | ORAL | Status: AC
  Administered 2019-04-28: 4 mg via ORAL

## 2019-04-28 MED ORDER — ONDANSETRON HCL 4 MG PO TABS: 4.0000 mg | ORAL_TABLET | Freq: Four times a day (QID) | ORAL | 0 refills | Status: DC | PRN

## 2019-04-28 NOTE — ED Provider Notes (Signed)
Robert Smith    CSN: [id number] Arrival date & time: 04/28/19  1515      History   Chief Complaint Chief Complaint  Patient presents with  . Cough  . Headache    HPI Robert Smith is a 44 y.o. male.   Patient presents with request for a COVID test.  He reports nonproductive cough, headache, and fever x 2 days.  He develop nausea today.  He reports COVID positive contacts at work.  He denies congestion, shortness of breath, vomiting, diarrhea, rash, or other symptoms.  He has been treating his symptoms at home with OTC ibuprofen.  Patient tested negative for COVID on 03/04/2019.    The history is provided by the patient.    Past Medical History:  Diagnosis Date  . Dengue fever     Patient Active Problem List   Diagnosis Date Noted  . H. pylori infection 11/14/2017  . Elevated hemoglobin A1c 11/08/2017  . Mixed hyperlipidemia 11/08/2017  . Elevated LFTs 10/22/2017  . Gastroesophageal reflux disease 10/22/2017  . Sickle cell trait ([place]) 10/22/2017    Past Surgical History:  Procedure Laterality Date  . IMPACTED THIRD MOLAR REMOVAL    . VASECTOMY  08/17/2017   [facility] Dr Bernardo Heater       Home Medications    Prior to Admission medications   Medication Sig Start Date End Date Taking? Authorizing Provider  mupirocin ointment (BACTROBAN) 2 % Apply 1 application topically 2 (two) times daily as needed. As needed for 1 week. Apply to back as needed spots only for skin infection 10/22/17   Parks Ranger, Devonne Doughty, DO  omeprazole (PRILOSEC) 20 MG capsule Take 1 capsule (20 mg total) by mouth daily before breakfast. 01/03/18 03/04/19  Karamalegos, Devonne Doughty, DO  ondansetron (ZOFRAN) 4 MG tablet Take 1 tablet (4 mg total) by mouth every 6 (six) hours as needed for nausea or vomiting. 04/28/19   Sharion Balloon, NP  predniSONE (DELTASONE) 10 MG tablet Start 60 mg po day one, then 50 mg po day two, taper by 10 mg daily until complete. 02/24/19   Norval Gable, MD    Family History  Family History  Problem Relation Age of Onset  . Sickle cell trait Son   . Sickle cell anemia Son   . Hypertension Mother   . Hyperlipidemia Mother   . Heart disease Father   . Heart attack Father   . Prostate cancer Paternal Grandfather 21       dx age 72  . Prostate cancer Paternal Uncle   . Diabetes Maternal Grandmother   . Brain cancer Other        Glioblastoma Multiforme  . Bladder Cancer Neg Hx   . Kidney cancer Neg Hx     Social History Social History   Tobacco Use  . Smoking status: Former Smoker    Quit date: 03/04/1999    Years since quitting: 20.1  . Smokeless tobacco: Never Used  Substance Use Topics  . Alcohol use: No  . Drug use: Never     Allergies   Aspirin and Penicillins   Review of Systems Review of Systems  Constitutional: Positive for fever. Negative for chills.  HENT: Negative for congestion, ear pain, rhinorrhea and sore throat.   Eyes: Negative for pain and visual disturbance.  Respiratory: Positive for cough. Negative for shortness of breath.   Cardiovascular: Negative for chest pain and palpitations.  Gastrointestinal: Negative for abdominal pain, diarrhea, nausea and vomiting.  Genitourinary: Negative for  dysuria and hematuria.  Musculoskeletal: Negative for arthralgias and back pain.  Skin: Negative for color change and rash.  Neurological: Positive for headaches. Negative for dizziness, seizures, syncope, weakness and numbness.  All other systems reviewed and are negative.    Physical Exam Triage Vital Signs ED Triage Vitals  Enc Vitals Group     BP      Pulse      Resp      Temp      Temp src      SpO2      Weight      Height      Head Circumference      Peak Flow      Pain Score      Pain Loc      Pain Edu?      Excl. in [place]?    No data found.  Updated Vital Signs BP 116/87 (BP Location: Left Arm)   Pulse (!) 107   Temp 99.8 F (37.7 C)   Resp 18   Wt 220 lb (99.8 kg)   SpO2 95%   BMI 32.49 kg/m    Visual Acuity Right Eye Distance:   Left Eye Distance:   Bilateral Distance:    Right Eye Near:   Left Eye Near:    Bilateral Near:     Physical Exam Vitals and nursing note reviewed.  Constitutional:      General: He is not in acute distress.    Appearance: He is well-developed.  HENT:     Head: Normocephalic and atraumatic.     Right Ear: Tympanic membrane normal.     Left Ear: Tympanic membrane normal.     Nose: Nose normal.     Mouth/Throat:     Mouth: Mucous membranes are moist.     Pharynx: Oropharynx is clear.  Eyes:     Conjunctiva/sclera: Conjunctivae normal.  Cardiovascular:     Rate and Rhythm: Normal rate and regular rhythm.     Heart sounds: No murmur.  Pulmonary:     Effort: Pulmonary effort is normal. No respiratory distress.     Breath sounds: Normal breath sounds.  Abdominal:     General: Bowel sounds are normal.     Palpations: Abdomen is soft.     Tenderness: There is no abdominal tenderness. There is no guarding or rebound.  Musculoskeletal:     Cervical back: Neck supple.  Skin:    General: Skin is warm and dry.     Findings: No rash.  Neurological:     General: No focal deficit present.     Mental Status: He is alert and oriented to person, place, and time.     Sensory: No sensory deficit.     Motor: No weakness.     Gait: Gait normal.  Psychiatric:        Mood and Affect: Mood normal.        Behavior: Behavior normal.      UC Treatments / Results  Labs (all labs ordered are listed, but only abnormal results are displayed) Labs Reviewed  POC SARS CORONAVIRUS 2 AG -  ED - Abnormal; Notable for the following components:      Result Value   SARS Coronavirus 2 Ag Positive (*)    All other components within normal limits    EKG   Radiology No results found.  Procedures Procedures (including critical care time)  Medications Ordered in UC Medications  ondansetron (ZOFRAN-ODT) disintegrating tablet 4 mg (  4 mg Oral Given 04/28/19  1556)    Initial Impression / Assessment and Plan / UC Course  I have reviewed the triage vital signs and the nursing notes.  Pertinent labs & imaging results that were available during my care of the patient were reviewed by me and considered in my medical decision making (see chart for details).   COVID-19.  POC COVID test positive.  Patient had one episode of emesis while here in office; small amount of clear phlegm.  Treating with Zofran.  Instructed patient to self quarantine per CDC guidelines.  Instructed patient to go to the emergency department if he has high fever not relieved by Tylenol, severe vomiting or diarrhea, shortness of breath, or other concerning symptoms.  Patient agrees to plan of care.     Final Clinical Impressions(s) / UC Diagnoses   Final diagnoses:  Fever, unspecified  COVID-19     Discharge Instructions     Take the prescribed medication as needed for nausea and vomiting.    Your COVID test is positive.    You should self-quarantine for:  *10 days since your symptoms first appeared and  *24 hours with no fever, without the use of fever-reducing medications and  *your other symptoms of COVID are improving.  Most people do not need to be re-tested at the end of the quarantine period.    Go to the emergency department if you have high fever not relieved by Tylenol, shortness of breath, severe diarrhea, or other concerning symptoms.       ED Prescriptions    Medication Sig Dispense Auth. Provider   ondansetron (ZOFRAN) 4 MG tablet Take 1 tablet (4 mg total) by mouth every 6 (six) hours as needed for nausea or vomiting. 12 tablet Sharion Balloon, NP     PDMP not reviewed this encounter.   Sharion Balloon, NP 04/28/19 [phone]

## 2019-04-28 NOTE — ED Triage Notes (Signed)
Patient in office today c/o cough headache and fever x 2d  Patient works in [employer]  VAP:OLIDC

## 2019-04-28 NOTE — Discharge Instructions (Addendum)
Take the prescribed medication as needed for nausea and vomiting.    Your COVID test is positive.    You should self-quarantine for:  *10 days since your symptoms first appeared and  *24 hours with no fever, without the use of fever-reducing medications and  *your other symptoms of COVID are improving.  Most people do not need to be re-tested at the end of the quarantine period.    Go to the emergency department if you have high fever not relieved by Tylenol, shortness of breath, severe diarrhea, or other concerning symptoms.

## 2020-01-21 IMAGING — US US RENAL
1 series · 14 of 25 positions shown · non-contrast
Comparison: None.

CLINICAL DATA: Bilateral low back pain and hematuria. Urinary
frequency. Recent vasectomy.

EXAM:
RENAL / URINARY TRACT ULTRASOUND COMPLETE

[Series 1: us renal · 0.34mm/px · 14 of 56 slices shown]
[im 1/56]
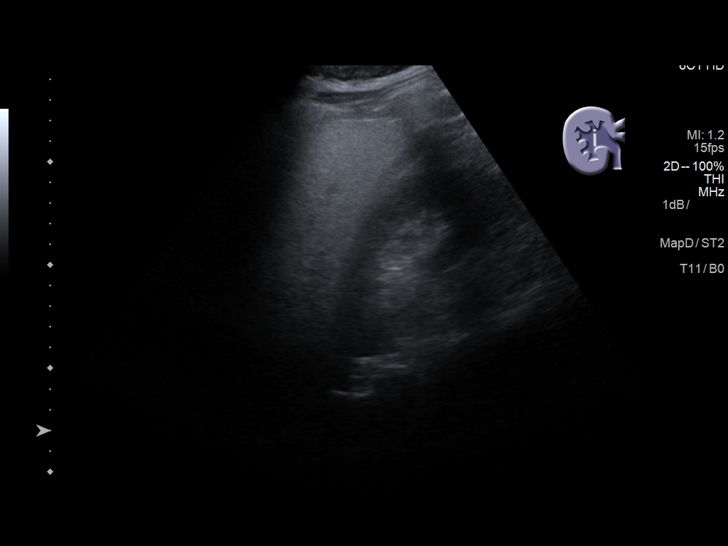
[im 5/56]
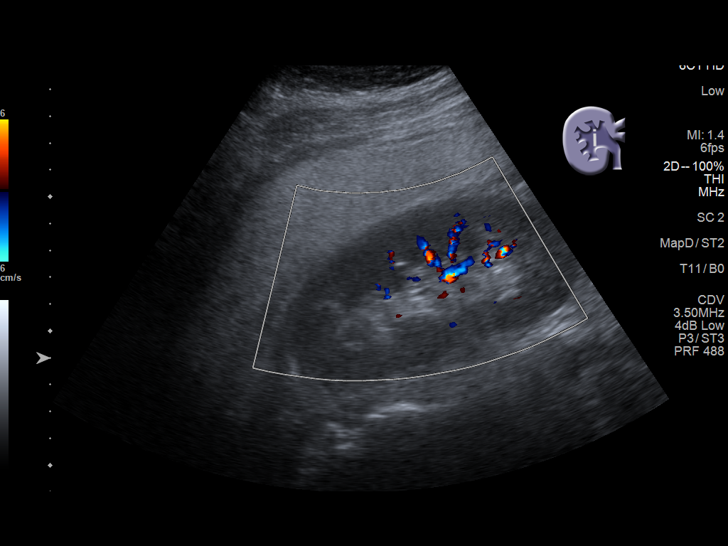
[im 10/56]
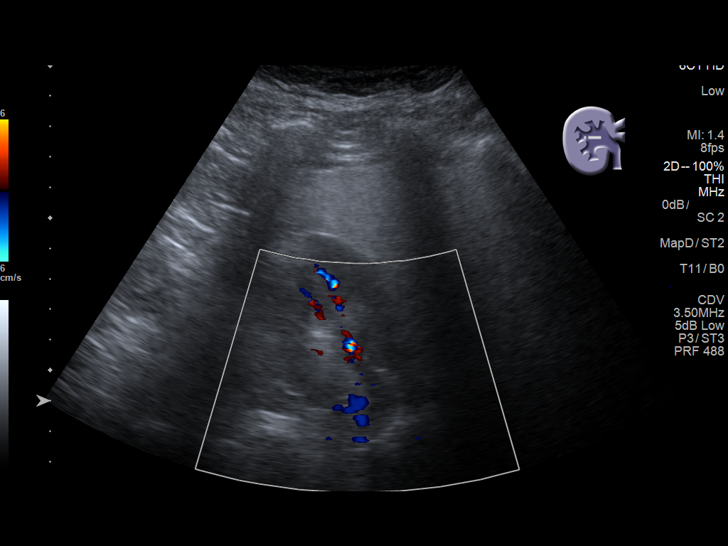
[im 14/56]
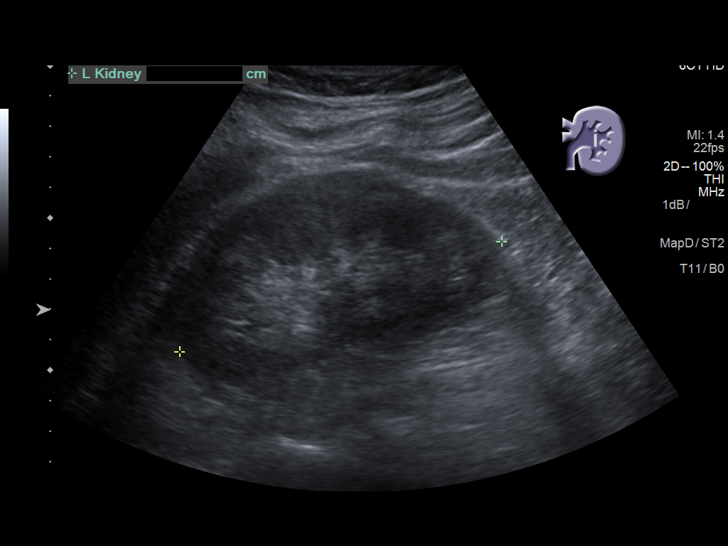
[im 19/56]
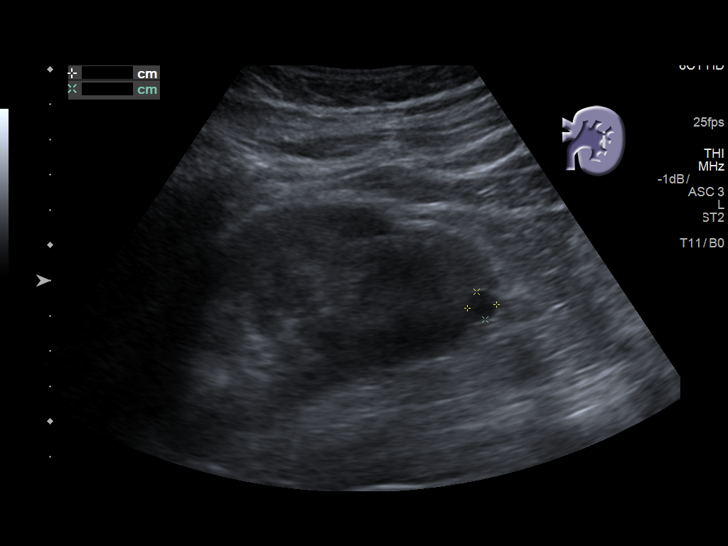
[im 21/56]
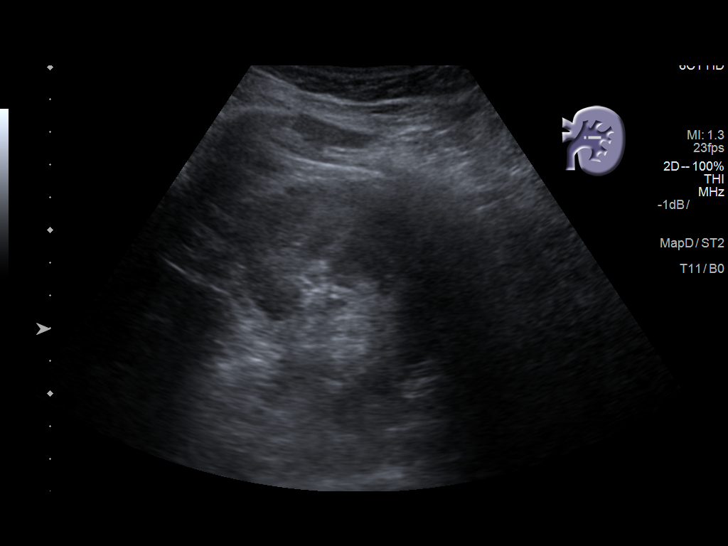
[im 26/56]
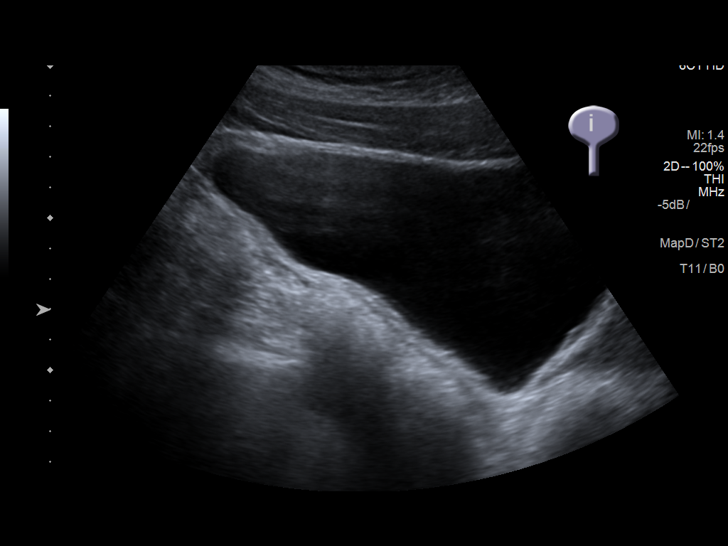
[im 30/56]
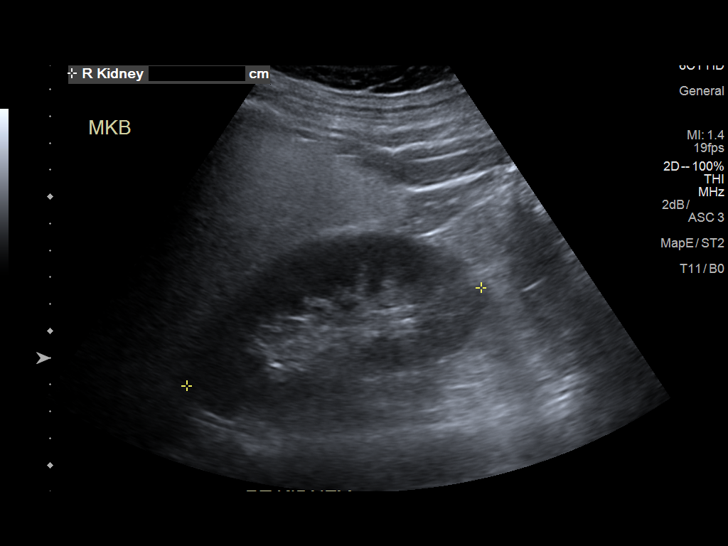
[im 35/56]
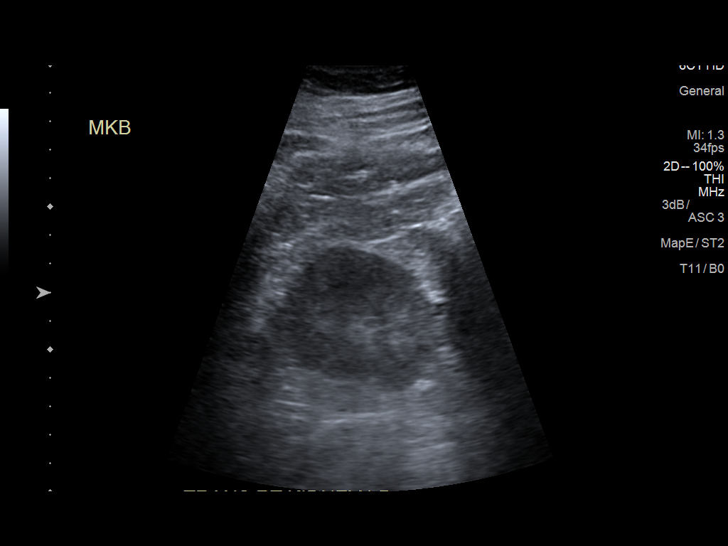
[im 37/56]
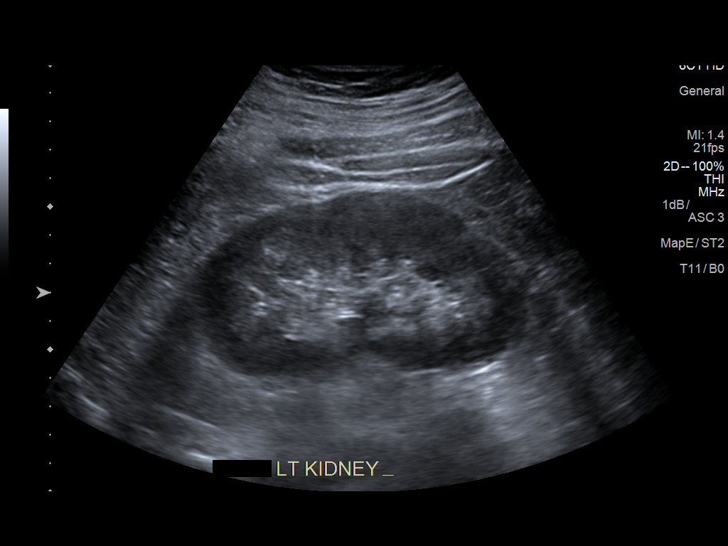
[im 42/56]
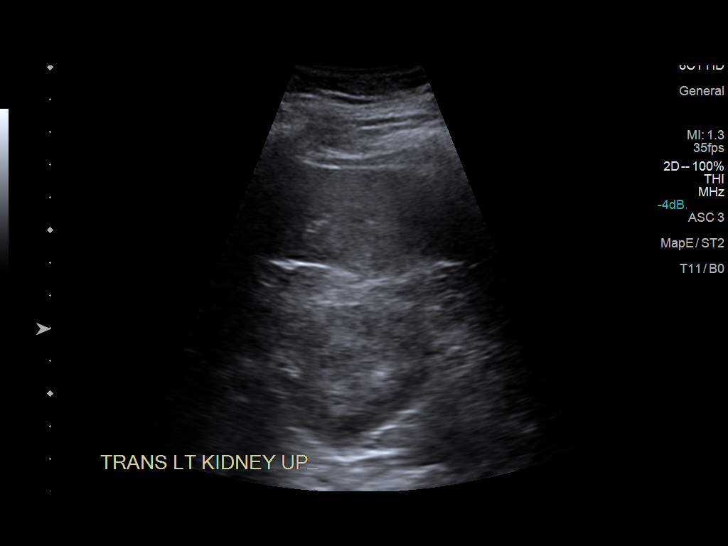
[im 46/56]
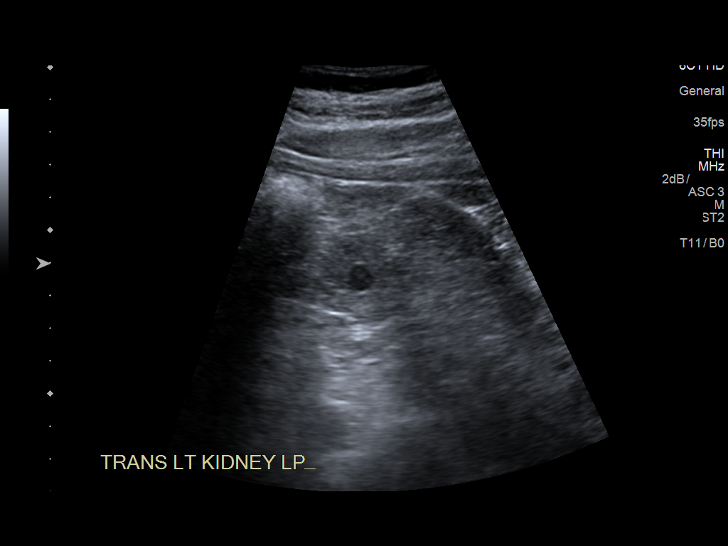
[im 51/56]
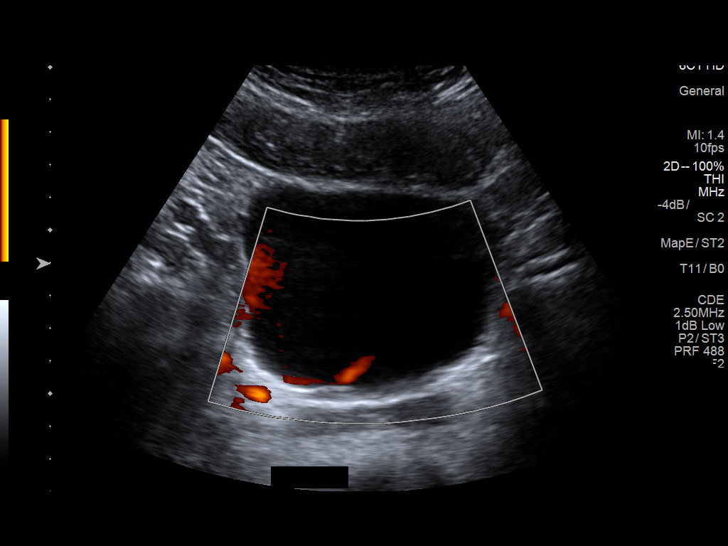
[im 56/56]
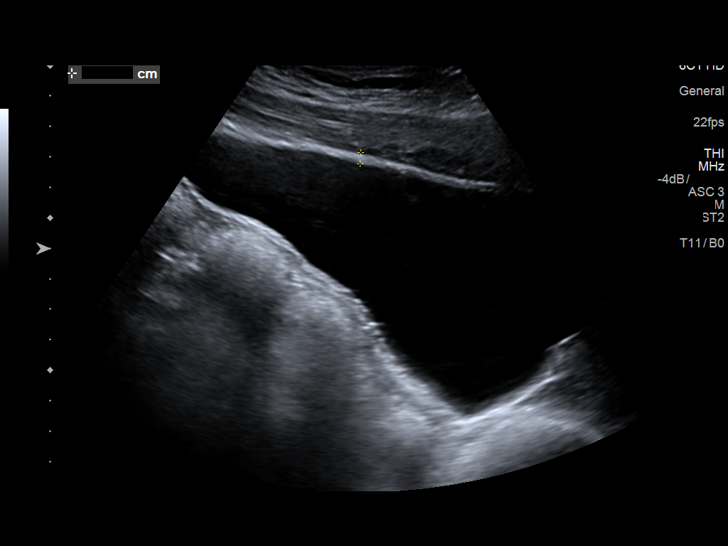

[14 of 25 positions shown; findings below may reference images not displayed]

FINDINGS: Right Kidney:

Length: 12.0 cm. Echogenicity within normal limits. No mass or
hydronephrosis visualized.

Left Kidney:

Length: 11.5 cm. Echogenicity within normal limits. No mass or
hydronephrosis visualized. 1 cm lower pole cyst.

Bladder:

Appears normal for degree of bladder distention. Normal ureteral
jets.
IMPRESSION: Normal urinary tract ultrasound. No sonographic evidence of
obstruction or advanced infection.

These results will be called to the ordering clinician or
representative by the Radiologist Assistant, and communication
documented in the PACS or zVision Dashboard.

## 2022-01-13 ENCOUNTER — Other Ambulatory Visit: Payer: Self-pay

## 2022-01-13 ENCOUNTER — Emergency Department: Payer: Medicaid Other

## 2022-01-13 ENCOUNTER — Observation Stay
Admission: EM | Admit: 2022-01-13 | Discharge: 2022-01-14 | Disposition: A | Discharge status: Home / Self Care | Payer: Medicaid Other | Attending: Internal Medicine | Admitting: Internal Medicine

## 2022-01-13 ENCOUNTER — Encounter: Payer: Self-pay | Admitting: Emergency Medicine

## 2022-01-13 DIAGNOSIS — K219 Gastro-esophageal reflux disease without esophagitis: Secondary | ICD-10-CM | POA: Diagnosis present

## 2022-01-13 DIAGNOSIS — G4489 Other headache syndrome: Secondary | ICD-10-CM | POA: Diagnosis not present

## 2022-01-13 DIAGNOSIS — I160 Hypertensive urgency: Principal | ICD-10-CM | POA: Diagnosis present

## 2022-01-13 DIAGNOSIS — Z87891 Personal history of nicotine dependence: Secondary | ICD-10-CM | POA: Diagnosis not present

## 2022-01-13 DIAGNOSIS — R519 Headache, unspecified: Secondary | ICD-10-CM | POA: Diagnosis present

## 2022-01-13 DIAGNOSIS — I10 Essential (primary) hypertension: Secondary | ICD-10-CM

## 2022-01-13 LAB — CBC
HCT: 42.4 % (ref 39.0–52.0)
Hemoglobin: 14.9 g/dL (ref 13.0–17.0)
MCH: 28.6 pg (ref 26.0–34.0)
MCHC: 35.1 g/dL (ref 30.0–36.0)
MCV: 81.4 fL (ref 80.0–100.0)
Platelets: 215 10*3/uL (ref 150–400)
RBC: 5.21 MIL/uL (ref 4.22–5.81)
RDW: 12.4 % (ref 11.5–15.5)
WBC: 6 10*3/uL (ref 4.0–10.5)
nRBC: 0 % (ref 0.0–0.2)

## 2022-01-13 LAB — BASIC METABOLIC PANEL
Anion gap: 9 (ref 5–15)
BUN: 11 mg/dL (ref 6–20)
CO2: 25 mmol/L (ref 22–32)
Calcium: 9.7 mg/dL (ref 8.9–10.3)
Chloride: 104 mmol/L (ref 98–111)
Creatinine, Ser: 0.85 mg/dL (ref 0.61–1.24)
GFR, Estimated: >60 mL/min (ref >60)
Glucose, Bld: 135 mg/dL — ABNORMAL HIGH (ref 70–99)
Potassium: 3.8 mmol/L (ref 3.5–5.1)
Sodium: 138 mmol/L (ref 135–145)

## 2022-01-13 MED ORDER — ENOXAPARIN SODIUM 60 MG/0.6ML IJ SOSY
0.5000 mg/kg | PREFILLED_SYRINGE | INTRAMUSCULAR | Status: DC
  Administered 2022-01-13: 50 mg via SUBCUTANEOUS
  Filled 2022-01-13: qty 0.6

## 2022-01-13 MED ORDER — IOHEXOL 350 MG/ML SOLN
75.0000 mL | Freq: Once | INTRAVENOUS | Status: AC | PRN
  Administered 2022-01-13: 75 mL via INTRAVENOUS

## 2022-01-13 MED ORDER — ACETAMINOPHEN 325 MG PO TABS
650.0000 mg | ORAL_TABLET | Freq: Four times a day (QID) | ORAL | Status: DC | PRN
  Administered 2022-01-14: 650 mg via ORAL
  Filled 2022-01-13: qty 2

## 2022-01-13 MED ORDER — ACETAMINOPHEN 500 MG PO TABS
1000.0000 mg | ORAL_TABLET | ORAL | Status: AC
  Administered 2022-01-13: 1000 mg via ORAL
  Filled 2022-01-13: qty 2

## 2022-01-13 MED ORDER — ONDANSETRON HCL 4 MG/2ML IJ SOLN: 4.0000 mg | Freq: Four times a day (QID) | INTRAMUSCULAR | Status: DC | PRN

## 2022-01-13 MED ORDER — AMLODIPINE BESYLATE 10 MG PO TABS
10.0000 mg | ORAL_TABLET | Freq: Every day | ORAL | Status: DC
  Administered 2022-01-14: 10 mg via ORAL
  Filled 2022-01-13: qty 1

## 2022-01-13 MED ORDER — ONDANSETRON HCL 4 MG PO TABS: 4.0000 mg | ORAL_TABLET | Freq: Four times a day (QID) | ORAL | Status: DC | PRN

## 2022-01-13 MED ORDER — PANTOPRAZOLE SODIUM 40 MG PO TBEC
40.0000 mg | DELAYED_RELEASE_TABLET | Freq: Every day | ORAL | Status: DC
  Administered 2022-01-13 – 2022-01-14 (×2): 40 mg via ORAL
  Filled 2022-01-13 (×2): qty 1

## 2022-01-13 MED ORDER — AMLODIPINE BESYLATE 5 MG PO TABS
5.0000 mg | ORAL_TABLET | Freq: Once | ORAL | Status: AC
  Administered 2022-01-13: 5 mg via ORAL
  Filled 2022-01-13: qty 1

## 2022-01-13 MED ORDER — HYDRALAZINE HCL 20 MG/ML IJ SOLN
10.0000 mg | Freq: Four times a day (QID) | INTRAMUSCULAR | Status: DC | PRN
  Administered 2022-01-14: 10 mg via INTRAVENOUS
  Filled 2022-01-13: qty 1

## 2022-01-13 MED ORDER — ENOXAPARIN SODIUM 40 MG/0.4ML IJ SOSY: 40.0000 mg | PREFILLED_SYRINGE | INTRAMUSCULAR | Status: DC

## 2022-01-13 MED ORDER — AMLODIPINE BESYLATE 5 MG PO TABS: 10.0000 mg | ORAL_TABLET | Freq: Once | ORAL | Status: DC

## 2022-01-13 MED ORDER — HYDRALAZINE HCL 20 MG/ML IJ SOLN
5.0000 mg | INTRAMUSCULAR | Status: AC
  Administered 2022-01-13: 5 mg via INTRAVENOUS
  Filled 2022-01-13: qty 1

## 2022-01-13 MED ORDER — ACETAMINOPHEN 650 MG RE SUPP: 650.0000 mg | Freq: Four times a day (QID) | RECTAL | Status: DC | PRN

## 2022-01-13 NOTE — Progress Notes (Signed)
PHARMACIST - PHYSICIAN COMMUNICATION  CONCERNING:  Enoxaparin (Lovenox) for DVT Prophylaxis    RECOMMENDATION: Patient was prescribed enoxaprin 40mg  q24 hours for VTE prophylaxis.   Filed Weights   01/13/22 1619  Weight: 99.8 kg (220 lb 0.3 oz)    Body mass index is 32.49 kg/m.  Estimated Creatinine Clearance: 127.8 mL/min (by C-G formula based on SCr of 0.85 mg/dL).   Based on [place] patient is candidate for enoxaparin 0.5mg /kg TBW SQ every 24 hours based on BMI being >30.   DESCRIPTION: Pharmacy has adjusted enoxaparin dose per [facility] policy.  Patient is now receiving enoxaparin 0.5 mg/kg every 24 hours    Dallie Piles, PharmD Clinical Pharmacist  01/13/2022 7:02 PM

## 2022-01-13 NOTE — Assessment & Plan Note (Signed)
Continue PPI ?

## 2022-01-13 NOTE — ED Provider Notes (Signed)
[facility] Provider Note    Event Date/Time   First MD Initiated Contact with Patient 01/13/22 1637     (approximate)   History   Headache   HPI  Robert Smith is a 46 y.o. male reports no significant past medical history other than having dengue fever while in [place] 20 plus years ago   Woke up this morning and noticed he had blurry vision and a mild headache, and worsened throughout the morning and day.  While at work he was having a throbbing severe headache and he went and had a nurse evaluate him and his blood pressure was severely elevated on at least a couple of checks.  He came to the ER for this reason.  No chest pain no trouble breathing no leg swelling.  No nausea or vomiting.  Reports a moderate to severe throbbing headache  He does not have a history of hypertension   Physical Exam   Triage Vital Signs: ED Triage Vitals  Enc Vitals Group     BP 01/13/22 1618 (!) 171/120     Pulse Rate 01/13/22 1618 76     Resp 01/13/22 1618 16     Temp 01/13/22 1618 98.4 F (36.9 C)     Temp Source 01/13/22 1618 Oral     SpO2 01/13/22 1618 97 %     Weight 01/13/22 1619 220 lb 0.3 oz (99.8 kg)     Height 01/13/22 1619 5\' 9"  (1.753 m)     Head Circumference --      Peak Flow --      Pain Score 01/13/22 1618 10     Pain Loc --      Pain Edu? --      Excl. in [place]? --     Most recent vital signs: Vitals:   01/13/22 1815 01/13/22 1830  BP: (!) 165/109 (!) 132/108  Pulse:  77  Resp:    Temp:    SpO2:  98%     General: Awake, no distress.  Appears in some discomfort, leaning forward reporting his head is pounding and throbbing. Extraocular movements are normal.  Cranial nerve exam normal.  Moves all extremities well with normal strength. CV:  Good peripheral perfusion.  Normal heart tones Resp:  Normal effort.  Clear bilateral Abd:  No distention.  Other:     ED Results / Procedures / Treatments   Labs (all labs ordered are listed, but  only abnormal results are displayed) Labs Reviewed  BASIC METABOLIC PANEL - Abnormal; Notable for the following components:      Result Value   Glucose, Bld 135 (*)    All other components within normal limits  CBC     EKG  Interpreted by me at 1620 heart rate 80 QRS 100 QTc 450 Normal sinus rhythm no evidence of acute ischemia or ectopy   RADIOLOGY  CT head interpreted by me for gross pathology, negative for acute intracranial hemorrhage.   CT Angio Head W or Wo Contrast  Result Date: 01/13/2022 CLINICAL DATA:  Headache, blurred vision, dizzy, high blood pressure EXAM: CT ANGIOGRAPHY HEAD TECHNIQUE: Multidetector CT imaging of the head was performed using the standard protocol during bolus administration of intravenous contrast. Multiplanar CT image reconstructions and MIPs were obtained to evaluate the vascular anatomy. RADIATION DOSE REDUCTION: This exam was performed according to the departmental dose-optimization program which includes automated exposure control, adjustment of the mA and/or kV according to patient size and/or use of iterative  reconstruction technique. CONTRAST:  51mL OMNIPAQUE IOHEXOL 350 MG/ML SOLN COMPARISON:  CT head 05/22/2016, no prior CTA FINDINGS: CT HEAD Brain: No evidence of acute infarct, hemorrhage, mass, mass effect, or midline shift. No hydrocephalus or extra-axial fluid collection. Vascular: No hyperdense vessel. Skull: Normal. Negative for fracture or focal lesion. Sinuses/Orbits: No acute finding. Other: The mastoid air cells are well aerated. CTA HEAD FINDINGS Anterior circulation: Both internal carotid arteries are patent to the termini, without significant stenosis. A1 segments patent. Normal anterior communicating artery. Anterior cerebral arteries are patent to their distal aspects. No M1 stenosis or occlusion. MCA branches perfused and symmetric. Posterior circulation: Vertebral arteries patent to the vertebrobasilar junction without stenosis.  Posterior inferior cerebellar arteries patent proximally. Basilar patent to its distal aspect. Superior cerebellar arteries patent proximally. Patent P1 segments. PCAs perfused to their distal aspects without stenosis. The bilateral posterior communicating arteries are not visualized. Venous sinuses: As permitted by contrast timing, patent. Anatomic variants: None significant. Review of the MIP images confirms the above findings IMPRESSION: 1.  No acute intracranial process. 2.  No intracranial large vessel occlusion or significant stenosis. Electronically Signed   By: Wiliam Ke M.D.   On: 01/13/2022 17:35       PROCEDURES:  Critical Care performed: No  Procedures   MEDICATIONS ORDERED IN ED: Medications  amLODipine (NORVASC) tablet 5 mg (has no administration in time range)  amLODipine (NORVASC) tablet 5 mg (5 mg Oral Given 01/13/22 1701)  iohexol (OMNIPAQUE) 350 MG/ML injection 75 mL (75 mLs Intravenous Contrast Given 01/13/22 1720)  hydrALAZINE (APRESOLINE) injection 5 mg (5 mg Intravenous Given 01/13/22 1811)     IMPRESSION / MDM / ASSESSMENT AND PLAN / ED COURSE  I reviewed the triage vital signs and the nursing notes.                              Differential diagnosis includes, but is not limited to, hypertensive urgency, hypertensive emergency, intracranial hemorrhage, subarachnoid hemorrhage, acute renal failure etc.  Differential diagnosis is fairly broad but is likely underlying or related to his severe hypertension.  Thankfully CT of the head with and with contrast shows no evidence of acute hemorrhage.  His clinical symptoms seem suggestive of hypertensive urgency or malignant hypertension.  Given oral antihypertensive with no noted improvement.  After receiving hydralazine markedly improved.  Alert well-oriented headache resolved.  Patient's presentation is most consistent with acute presentation with potential threat to life or bodily function.  The patient is on the  cardiac monitor to evaluate for evidence of arrhythmia and/or significant heart rate changes.  Clinical Course as of 01/13/22 1847  Robert Smith Jan 13, 2022  1830 Patient reports his headache is now resolved.  Vision is normalized [MQ]    Clinical Course User Index [MQ] Sharyn Creamer, MD   Labs interpreted as normal CBC and metabolic panel.  No clinical symptoms to suggest ACS namely no chest pain.  EKG without ischemia.  ----------------------------------------- 6:46 PM on 01/13/2022 ----------------------------------------- Patient still with severe diastolic hypertension. Given this finding and his presentation with visual disturbance severe headache now resolved I think that he would benefit from observation and admission to the hospital for further tailoring and evaluation of his presentation today and suspect need for initiation of antihypertensives.  Clinical case and consult discussed with Hospitalist Dr. Joylene Igo who is excepting of admission  Patient is understanding agreeable with plan for admission FINAL CLINICAL IMPRESSION(S) / ED DIAGNOSES  Final diagnoses:  Malignant hypertension  Other headache syndrome     Rx / DC Orders   ED Discharge Orders     None        Note:  This document was prepared using Dragon voice recognition software and may include unintentional dictation errors.   Sharyn Creamer, MD 01/13/22 [phone]

## 2022-01-13 NOTE — ED Triage Notes (Signed)
C/O headache today.  C/o pain to back of head.  Has not taken any medication for pain.  States someone at work took his blood pressure and it was "High".  AAOx3.  Skin warm and dry. NAD

## 2022-01-13 NOTE — Assessment & Plan Note (Signed)
Patient presents to the ER for evaluation of a severe headache, nausea and blurred vision and found to have markedly elevated blood pressure. No known history of hypertension but significant family history. We will start patient on amlodipine 10 mg daily As needed IV hydralazine Lifestyle modification and exercise has been discussed with patient in detail

## 2022-01-13 NOTE — ED Notes (Signed)
Pt reports recent issue with HA, HTN, vision changes and nausea. States nausea is very mild currently. Pt's speech is clear and A&Ox4.

## 2022-01-13 NOTE — H&P (Signed)
History and Physical    Patient: Robert Smith DOB: 1975/07/22 DOA: 01/13/2022 DOS: the patient was seen and examined on 01/13/2022 PCP: Pcp, No  Patient coming from: Home  Chief Complaint:  Chief Complaint  Patient presents with   Headache   HPI: Robert Smith is a 46 y.o. male with no significant past medical history except for ADHD and GERD who presents to the emergency room for evaluation of headache, visual changes and nausea as well as elevated blood pressure. Patient states that he woke up this morning with a severe occipital headache which he rated a 10 x 10 in intensity at its worst.  He describes the pain as a throbbing constant pain associated with nausea but no vomiting.  During the course of the day he developed blurred vision and had his blood pressure checked by a nurse at his job and he was elevated with systolic blood pressure over 160. He decided to come into the ER for further evaluation. He denies having any chest pain, no shortness of breath, no palpitations, no diaphoresis, no leg swelling, no hematuria, no urinary symptoms, no changes in his bowel habits, no abdominal pain, no dizziness, no lightheadedness, no focal deficit. He had a CT angiogram of the head which showed no acute intracranial process. No intracranial large vessel occlusion or significant stenosis. He received a dose of IV hydralazine and amlodipine 5 mg with some improvement in his blood pressure but diastolic blood pressure remains elevated. His headache has improved but not completely resolved. He will be referred to observation status for further evaluation     Review of Systems: As mentioned in the history of present illness. All other systems reviewed and are negative. Past Medical History:  Diagnosis Date   Dengue fever    Past Surgical History:  Procedure Laterality Date   IMPACTED THIRD MOLAR REMOVAL     VASECTOMY  08/17/2017   [facility] Dr Bernardo Heater   Social History:  reports that he  quit smoking about 22 years ago. He has never used smokeless tobacco. He reports that he does not drink alcohol and does not use drugs.  Allergies  Allergen Reactions   Penicillins     Has patient had a PCN reaction causing immediate rash, facial/tongue/throat swelling, SOB or lightheadedness with hypotension: yes Has patient had a PCN reaction causing severe rash involving mucus membranes or skin necrosis: no Has patient had a PCN reaction that required hospitalization no Has patient had a PCN reaction occurring within the last 10 years: no If all of the above answers are "NO", then may proceed with Cephalosporin use.  Causes dizziness and weakness      Family History  Problem Relation Age of Onset   Sickle cell trait Son    Sickle cell anemia Son    Hypertension Mother    Hyperlipidemia Mother    Heart disease Father    Heart attack Father    Prostate cancer Paternal Grandfather 57       dx age 42   Prostate cancer Paternal Uncle    Diabetes Maternal Grandmother    Brain cancer Other        Glioblastoma Multiforme   Bladder Cancer Neg Hx    Kidney cancer Neg Hx     Prior to Admission medications   Medication Sig Start Date End Date Taking? Authorizing Provider  mupirocin ointment (BACTROBAN) 2 % Apply 1 application topically 2 (two) times daily as needed. As needed for 1 week. Apply to back  as needed spots only for skin infection Patient not taking: Reported on 01/13/2022 10/22/17   Olin Hauser, DO  omeprazole (PRILOSEC) 20 MG capsule Take 1 capsule (20 mg total) by mouth daily before breakfast. 01/03/18 03/04/19  Karamalegos, Devonne Doughty, DO  ondansetron (ZOFRAN) 4 MG tablet Take 1 tablet (4 mg total) by mouth every 6 (six) hours as needed for nausea or vomiting. Patient not taking: Reported on 01/13/2022 04/28/19   Sharion Balloon, NP  predniSONE (DELTASONE) 10 MG tablet Start 60 mg po day one, then 50 mg po day two, taper by 10 mg daily until complete. Patient not  taking: Reported on 01/13/2022 02/24/19   Norval Gable, MD    Physical Exam: Vitals:   01/13/22 1800 01/13/22 1811 01/13/22 1815 01/13/22 1830  BP:  (!) 158/106 (!) 165/109 (!) 132/108  Pulse: 70   77  Resp: 17     Temp:      TempSrc:      SpO2: 95%   98%  Weight:      Height:       Physical Exam Vitals and nursing note reviewed.  Constitutional:      Appearance: He is well-developed.  HENT:     Head: Normocephalic and atraumatic.     Mouth/Throat:     Mouth: Mucous membranes are moist.  Cardiovascular:     Rate and Rhythm: Normal rate and regular rhythm.  Pulmonary:     Effort: Pulmonary effort is normal.     Breath sounds: Normal breath sounds.  Abdominal:     General: Bowel sounds are normal.     Palpations: Abdomen is soft.  Musculoskeletal:        General: Normal range of motion.     Cervical back: Normal range of motion and neck supple.  Skin:    General: Skin is warm and dry.  Neurological:     Mental Status: He is alert and oriented to person, place, and time.  Psychiatric:        Mood and Affect: Mood normal.        Behavior: Behavior normal.     Data Reviewed: Relevant notes from primary care and specialist visits, past discharge summaries as available in EHR, including Care Everywhere. Prior diagnostic testing as pertinent to current admission diagnoses Updated medications and problem lists for reconciliation ED course, including vitals, labs, imaging, treatment and response to treatment Triage notes, nursing and pharmacy notes and ED provider's notes Notable results as noted in HPI Labs reviewed.  Sodium 138, potassium 3.8, chloride 104, bicarb 25, glucose 135, BUN 11, creatinine 0.85, calcium 9.7, white count 6.0, hemoglobin 14.9, hematocrit 42.4, platelet count 215 Twelve-lead EKG reviewed by me shows normal sinus rhythm There are no new results to review at this time.  Assessment and Plan: * Hypertensive urgency Patient presents to the ER for  evaluation of a severe headache, nausea and blurred vision and found to have markedly elevated blood pressure. No known history of hypertension but significant family history. We will start patient on amlodipine 10 mg daily As needed IV hydralazine Lifestyle modification and exercise has been discussed with patient in detail  Gastroesophageal reflux disease Continue PPI      Advance Care Planning:   Code Status: Full Code   Consults: None  Family Communication: Greater than 50% of time was spent discussing patient's condition and plan of care with him at the bedside.  All questions and concerns have been addressed.  He verbalizes understanding  and agrees with the plan.  Severity of Illness: The appropriate patient status for this patient is OBSERVATION. Observation status is judged to be reasonable and necessary in order to provide the required intensity of service to ensure the patient's safety. The patient's presenting symptoms, physical exam findings, and initial radiographic and laboratory data in the context of their medical condition is felt to place them at decreased risk for further clinical deterioration. Furthermore, it is anticipated that the patient will be medically stable for discharge from the hospital within 2 midnights of admission.   Author: Collier Bullock, MD 01/13/2022 7:09 PM  For on call review [email or web address].

## 2022-01-14 DIAGNOSIS — I160 Hypertensive urgency: Secondary | ICD-10-CM | POA: Diagnosis not present

## 2022-01-14 LAB — CBC
HCT: 44.1 % (ref 39.0–52.0)
Hemoglobin: 15.5 g/dL (ref 13.0–17.0)
MCH: 28.4 pg (ref 26.0–34.0)
MCHC: 35.1 g/dL (ref 30.0–36.0)
MCV: 80.9 fL (ref 80.0–100.0)
Platelets: 224 10*3/uL (ref 150–400)
RBC: 5.45 MIL/uL (ref 4.22–5.81)
RDW: 12.6 % (ref 11.5–15.5)
WBC: 5.8 10*3/uL (ref 4.0–10.5)
nRBC: 0 % (ref 0.0–0.2)

## 2022-01-14 LAB — BASIC METABOLIC PANEL
Anion gap: 9 (ref 5–15)
BUN: 11 mg/dL (ref 6–20)
CO2: 23 mmol/L (ref 22–32)
Calcium: 9.8 mg/dL (ref 8.9–10.3)
Chloride: 103 mmol/L (ref 98–111)
Creatinine, Ser: 0.79 mg/dL (ref 0.61–1.24)
GFR, Estimated: >60 mL/min (ref >60)
Glucose, Bld: 203 mg/dL — ABNORMAL HIGH (ref 70–99)
Potassium: 3.9 mmol/L (ref 3.5–5.1)
Sodium: 135 mmol/L (ref 135–145)

## 2022-01-14 LAB — HIV ANTIBODY (ROUTINE TESTING W REFLEX): HIV Screen 4th Generation wRfx: NONREACTIVE

## 2022-01-14 MED ORDER — DEXAMETHASONE SODIUM PHOSPHATE 10 MG/ML IJ SOLN
6.0000 mg | Freq: Once | INTRAMUSCULAR | Status: AC
  Administered 2022-01-14: 6 mg via INTRAVENOUS
  Filled 2022-01-14 (×2): qty 0.6

## 2022-01-14 MED ORDER — SODIUM CHLORIDE 0.9 % IV SOLN
12.5000 mg | Freq: Once | INTRAVENOUS | Status: AC
  Administered 2022-01-14: 12.5 mg via INTRAVENOUS
  Filled 2022-01-14: qty 12.5
  Filled 2022-01-14: qty 0.5

## 2022-01-14 MED ORDER — HYDROCHLOROTHIAZIDE 12.5 MG PO TABS
12.5000 mg | ORAL_TABLET | Freq: Every day | ORAL | Status: DC
  Administered 2022-01-14: 12.5 mg via ORAL
  Filled 2022-01-14: qty 1

## 2022-01-14 MED ORDER — AMLODIPINE BESYLATE 10 MG PO TABS: 10.0000 mg | ORAL_TABLET | Freq: Every day | ORAL | 0 refills | Status: DC

## 2022-01-14 MED ORDER — HYDROCHLOROTHIAZIDE 25 MG PO TABS: 25.0000 mg | ORAL_TABLET | Freq: Every day | ORAL | 0 refills | Status: DC

## 2022-01-14 MED ORDER — HYDROCHLOROTHIAZIDE 12.5 MG PO TABS
12.5000 mg | ORAL_TABLET | Freq: Once | ORAL | Status: AC
  Administered 2022-01-14: 12.5 mg via ORAL
  Filled 2022-01-14: qty 1

## 2022-01-14 MED ORDER — DIPHENHYDRAMINE HCL 50 MG/ML IJ SOLN
25.0000 mg | Freq: Once | INTRAMUSCULAR | Status: AC
  Administered 2022-01-14: 25 mg via INTRAVENOUS
  Filled 2022-01-14: qty 1

## 2022-01-14 MED ORDER — ONDANSETRON HCL 4 MG PO TABS: 4.0000 mg | ORAL_TABLET | Freq: Four times a day (QID) | ORAL | 0 refills | Status: DC | PRN

## 2022-01-14 MED ORDER — HYDROCHLOROTHIAZIDE 25 MG PO TABS: 25.0000 mg | ORAL_TABLET | Freq: Every day | ORAL | Status: DC

## 2022-01-14 NOTE — Discharge Summary (Signed)
Physician Discharge Summary  Robert Smith [id number] DOB: Aug 29, 1975 DOA: 01/13/2022  PCP: Pcp, No  Admit date: 01/13/2022 Discharge date: 01/14/2022  Admitted From: Home Disposition:  Home  Recommendations for Outpatient Follow-up:  Follow up with PCP in 1-2 weeks   Home Health:No Equipment/Devices:None   Discharge Condition:Stable  CODE STATUS:FULL  Diet recommendation: Reg  Brief/Interim Summary: 46 y.o. male with no significant past medical history except for ADHD and GERD who presents to the emergency room for evaluation of headache, visual changes and nausea as well as elevated blood pressure. Patient states that he woke up this morning with a severe occipital headache which he rated a 10 x 10 in intensity at its worst.  He describes the pain as a throbbing constant pain associated with nausea but no vomiting.  During the course of the day he developed blurred vision and had his blood pressure checked by a nurse at his job and he was elevated with systolic blood pressure over 160. He decided to come into the ER for further evaluation. He denies having any chest pain, no shortness of breath, no palpitations, no diaphoresis, no leg swelling, no hematuria, no urinary symptoms, no changes in his bowel habits, no abdominal pain, no dizziness, no lightheadedness, no focal deficit. He had a CT angiogram of the head which showed no acute intracranial process. No intracranial large vessel occlusion or significant stenosis. He received a dose of IV hydralazine and amlodipine 5 mg with some improvement in his blood pressure but diastolic blood pressure remains elevated. His headache has improved but not completely resolved. He will be referred to observation status for further evaluation  Working diagnosis of hypertension and resultant intractable headache.  Headache was addressed with cocktail of IV Decadron, IV Benadryl, IV Phenergan.  Resulted in complete resolution of headache.  Blood  pressure control was achieved with initiation of amlodipine and hydrochlorothiazide.  Dose is uptitrated to 10 mg amlodipine, 25 mg hydrochlorothiazide.  Patient will discharge on these doses.  Resources given to establish care with primary care physician.  1 month supply of antihypertensive regimen provided on time of discharge.  Patient appropriate for discharge home at this time    Discharge Diagnoses:  Principal Problem:   Hypertensive urgency Active Problems:   Gastroesophageal reflux disease  * Hypertensive urgency Patient presents to the ER for evaluation of a severe headache, nausea and blurred vision and found to have markedly elevated blood pressure. No known history of hypertension but significant family history. Patient started on amlodipine 10 mg daily and hydrochlorothiazide 25 mg daily.  Good improvement in blood pressure and good improvement in headache.  Discharged on this dose.  List of PCP sent for patient to establish care.  Intractable headache Resolved at the time of discharge   Gastroesophageal reflux disease Continue PPI  Discharge Instructions  Discharge Instructions     Diet - low sodium heart healthy   Complete by: As directed    Increase activity slowly   Complete by: As directed       Allergies as of 01/14/2022       Reactions   Penicillins    Has patient had a PCN reaction causing immediate rash, facial/tongue/throat swelling, SOB or lightheadedness with hypotension: yes Has patient had a PCN reaction causing severe rash involving mucus membranes or skin necrosis: no Has patient had a PCN reaction that required hospitalization no Has patient had a PCN reaction occurring within the last 10 years: no If all of the above answers  are "NO", then may proceed with Cephalosporin use. Causes dizziness and weakness         Medication List     STOP taking these medications    mupirocin ointment 2 % Commonly known as: Bactroban   predniSONE 10 MG  tablet Commonly known as: DELTASONE       TAKE these medications    amLODipine 10 MG tablet Commonly known as: NORVASC Take 1 tablet (10 mg total) by mouth daily. Start taking on: January 15, 2022   hydrochlorothiazide 25 MG tablet Commonly known as: HYDRODIURIL Take 1 tablet (25 mg total) by mouth daily. Start taking on: January 15, 2022   omeprazole 20 MG capsule Commonly known as: PRILOSEC Take 1 capsule (20 mg total) by mouth daily before breakfast.   ondansetron 4 MG tablet Commonly known as: ZOFRAN Take 1 tablet (4 mg total) by mouth every 6 (six) hours as needed for nausea or vomiting.        Allergies  Allergen Reactions   Penicillins     Has patient had a PCN reaction causing immediate rash, facial/tongue/throat swelling, SOB or lightheadedness with hypotension: yes Has patient had a PCN reaction causing severe rash involving mucus membranes or skin necrosis: no Has patient had a PCN reaction that required hospitalization no Has patient had a PCN reaction occurring within the last 10 years: no If all of the above answers are "NO", then may proceed with Cephalosporin use.  Causes dizziness and weakness      Consultations: None   Procedures/Studies: CT Angio Head W or Wo Contrast  Result Date: 01/13/2022 CLINICAL DATA:  Headache, blurred vision, dizzy, high blood pressure EXAM: CT ANGIOGRAPHY HEAD TECHNIQUE: Multidetector CT imaging of the head was performed using the standard protocol during bolus administration of intravenous contrast. Multiplanar CT image reconstructions and MIPs were obtained to evaluate the vascular anatomy. RADIATION DOSE REDUCTION: This exam was performed according to the departmental dose-optimization program which includes automated exposure control, adjustment of the mA and/or kV according to patient size and/or use of iterative reconstruction technique. CONTRAST:  43mL OMNIPAQUE IOHEXOL 350 MG/ML SOLN COMPARISON:  CT head  05/22/2016, no prior CTA FINDINGS: CT HEAD Brain: No evidence of acute infarct, hemorrhage, mass, mass effect, or midline shift. No hydrocephalus or extra-axial fluid collection. Vascular: No hyperdense vessel. Skull: Normal. Negative for fracture or focal lesion. Sinuses/Orbits: No acute finding. Other: The mastoid air cells are well aerated. CTA HEAD FINDINGS Anterior circulation: Both internal carotid arteries are patent to the termini, without significant stenosis. A1 segments patent. Normal anterior communicating artery. Anterior cerebral arteries are patent to their distal aspects. No M1 stenosis or occlusion. MCA branches perfused and symmetric. Posterior circulation: Vertebral arteries patent to the vertebrobasilar junction without stenosis. Posterior inferior cerebellar arteries patent proximally. Basilar patent to its distal aspect. Superior cerebellar arteries patent proximally. Patent P1 segments. PCAs perfused to their distal aspects without stenosis. The bilateral posterior communicating arteries are not visualized. Venous sinuses: As permitted by contrast timing, patent. Anatomic variants: None significant. Review of the MIP images confirms the above findings IMPRESSION: 1.  No acute intracranial process. 2.  No intracranial large vessel occlusion or significant stenosis. Electronically Signed   By: Merilyn Baba M.D.   On: 01/13/2022 17:35      Subjective: Seen and examined on day of discharge.  Stable no distress.  Headache resolved.  Blood pressure control improved.  Stable for discharge home.  Discharge Exam: Vitals:   01/14/22 0909 01/14/22 1300  BP: (!) 138/106 (!) 140/93  Pulse: 88 96  Resp: 17 17  Temp: (!) 97.4 F (36.3 C)   SpO2: 100% 97%   Vitals:   01/14/22 0527 01/14/22 0622 01/14/22 0909 01/14/22 1300  BP: (!) 165/106 (!) 134/103 (!) 138/106 (!) 140/93  Pulse: 74 81 88 96  Resp:  17 17 17   Temp:   (!) 97.4 F (36.3 C)   TempSrc:      SpO2:  99% 100% 97%  Weight:       Height:        General: Pt is alert, awake, not in acute distress Cardiovascular: RRR, S1/S2 +, no rubs, no gallops Respiratory: CTA bilaterally, no wheezing, no rhonchi Abdominal: Soft, NT, ND, bowel sounds + Extremities: no edema, no cyanosis    The results of significant diagnostics from this hospitalization (including imaging, microbiology, ancillary and laboratory) are listed below for reference.     Microbiology: No results found for this or any previous visit (from the past 240 hour(s)).   Labs: BNP (last 3 results) No results for input(s): "BNP" in the last 8760 hours. Basic Metabolic Panel: Recent Labs  Lab 01/13/22 1623 01/14/22 0603  NA 138 135  K 3.8 3.9  CL 104 103  CO2 25 23  GLUCOSE 135* 203*  BUN 11 11  CREATININE 0.85 0.79  CALCIUM 9.7 9.8   Liver Function Tests: No results for input(s): "AST", "ALT", "ALKPHOS", "BILITOT", "PROT", "ALBUMIN" in the last 168 hours. No results for input(s): "LIPASE", "AMYLASE" in the last 168 hours. No results for input(s): "AMMONIA" in the last 168 hours. CBC: Recent Labs  Lab 01/13/22 1623 01/14/22 0603  WBC 6.0 5.8  HGB 14.9 15.5  HCT 42.4 44.1  MCV 81.4 80.9  PLT 215 224   Cardiac Enzymes: No results for input(s): "CKTOTAL", "CKMB", "CKMBINDEX", "TROPONINI" in the last 168 hours. BNP: Invalid input(s): "POCBNP" CBG: No results for input(s): "GLUCAP" in the last 168 hours. D-Dimer No results for input(s): "DDIMER" in the last 72 hours. Hgb A1c No results for input(s): "HGBA1C" in the last 72 hours. Lipid Profile No results for input(s): "CHOL", "HDL", "LDLCALC", "TRIG", "CHOLHDL", "LDLDIRECT" in the last 72 hours. Thyroid function studies No results for input(s): "TSH", "T4TOTAL", "T3FREE", "THYROIDAB" in the last 72 hours.  Invalid input(s): "FREET3" Anemia work up No results for input(s): "VITAMINB12", "FOLATE", "FERRITIN", "TIBC", "IRON", "RETICCTPCT" in the last 72 hours. Urinalysis     Component Value Date/Time   APPEARANCEUR Clear 10/17/2017 1354   GLUCOSEU Negative 10/17/2017 1354   BILIRUBINUR Negative 10/17/2017 1354   PROTEINUR Negative 10/17/2017 1354   UROBILINOGEN 0.2 09/20/2017 1017   NITRITE Negative 10/17/2017 1354   LEUKOCYTESUR Trace (A) 10/17/2017 1354   Sepsis Labs Recent Labs  Lab 01/13/22 1623 01/14/22 0603  WBC 6.0 5.8   Microbiology No results found for this or any previous visit (from the past 240 hour(s)).   Time coordinating discharge: Over 30 minutes  SIGNED:   Sidney Ace, MD  [facility] 01/14/2022, 2:28 PM Pager   If 7PM-7AM, please contact night-coverage

## 2022-01-14 NOTE — Plan of Care (Signed)
  Problem: Education: Goal: Knowledge of General Education information will improve Description: Including pain rating scale, medication(s)/side effects and non-pharmacologic comfort measures Outcome: Progressing   Problem: Health Behavior/Discharge Planning: Goal: Ability to manage health-related needs will improve Outcome: Progressing   Problem: Clinical Measurements: Goal: Ability to maintain clinical measurements within normal limits will improve Outcome: Progressing Goal: Will remain free from infection Outcome: Progressing Goal: Diagnostic test results will improve Outcome: Progressing Goal: Cardiovascular complication will be avoided Outcome: Progressing   Problem: Activity: Goal: Risk for activity intolerance will decrease Outcome: Progressing   Problem: Nutrition: Goal: Adequate nutrition will be maintained Outcome: Progressing   Problem: Elimination: Goal: Will not experience complications related to bowel motility Outcome: Progressing Goal: Will not experience complications related to urinary retention Outcome: Progressing   Problem: Pain Managment: Goal: General experience of comfort will improve Outcome: Progressing   Problem: Safety: Goal: Ability to remain free from injury will improve Outcome: Progressing   

## 2022-01-14 NOTE — TOC Progression Note (Signed)
Transition of Care [facility]) - Progression Note    Patient Details  Name: Robert Smith MRN: [id number] Date of Birth: Sep 19, 1975  [facility] CM/SW Contact  Izola Price, RN Phone Number: 01/14/2022, 2:21 PM  Clinical Narrative: 9/23: PCP resources sent to patient via printer/Unit RN. May discharge today. Simmie Davies RN CM            Expected Discharge Plan and Services                                                 Social Determinants of Health (SDOH) Interventions    Readmission Risk Interventions     No data to display

## 2022-01-14 NOTE — Plan of Care (Signed)
Patient discharged per MD orders at this time.All discharge instructions,education & medications reviewed with the patient.Pt expressed understanding and will comply with dc instructions.follow up appointments was also communicated to the patient.no verbal c/o or any ssx of distress.Pt was discharged home with self care per order.Pt was transported home by wife in a privately owned vehicle.

## 2022-09-19 ENCOUNTER — Other Ambulatory Visit: Payer: Self-pay | Admitting: Family Medicine

## 2022-09-19 DIAGNOSIS — Z9852 Vasectomy status: Secondary | ICD-10-CM

## 2022-09-20 ENCOUNTER — Other Ambulatory Visit: Payer: Medicaid Other

## 2022-09-20 DIAGNOSIS — Z9852 Vasectomy status: Secondary | ICD-10-CM

## 2022-09-21 LAB — POST-VAS SPERM EVALUATION,QUAL: Volume: 0.5 mL

## 2023-01-31 ENCOUNTER — Ambulatory Visit
Admission: EM | Admit: 2023-01-31 | Discharge: 2023-01-31 | Disposition: A | Discharge status: Home / Self Care | Payer: Medicaid Other | Attending: Emergency Medicine | Admitting: Emergency Medicine

## 2023-01-31 ENCOUNTER — Encounter: Payer: Self-pay | Admitting: Emergency Medicine

## 2023-01-31 DIAGNOSIS — R519 Headache, unspecified: Secondary | ICD-10-CM | POA: Diagnosis not present

## 2023-01-31 DIAGNOSIS — I10 Essential (primary) hypertension: Secondary | ICD-10-CM | POA: Diagnosis not present

## 2023-01-31 MED ORDER — AMLODIPINE BESYLATE 5 MG PO TABS: 5.0000 mg | ORAL_TABLET | Freq: Every day | ORAL | 1 refills | Status: DC

## 2023-01-31 NOTE — ED Provider Notes (Signed)
Renaldo Fiddler    CSN: [id number] Arrival date & time: 01/31/23  1453      History   Chief Complaint No chief complaint on file.   HPI Robert Smith is a 47 y.o. male.  Patient presents with headache x 2 days.  He attributes this to elevated blood pressure.  He is under a lot of stress at work lately.  No trauma.  This is not the worst headache of his life.  He denies vision change, dizziness, weakness, numbness, chest pain, shortness of breath, or other symptoms.  He reports history of hypertension but is not taking his medication.  He does not have a primary care provider.  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  The history is provided by the patient and medical records.    Past Medical History:  Diagnosis Date   Dengue fever     Patient Active Problem List   Diagnosis Date Noted   Hypertensive urgency 01/13/2022   H. pylori infection 11/14/2017   Elevated hemoglobin A1c 11/08/2017   Mixed hyperlipidemia 11/08/2017   Elevated LFTs 10/22/2017   Gastroesophageal reflux disease 10/22/2017   Sickle cell trait (HCC) 10/22/2017    Past Surgical History:  Procedure Laterality Date   IMPACTED THIRD MOLAR REMOVAL     VASECTOMY  08/17/2017   [facility] Dr Lonna Cobb       Home Medications    Prior to Admission medications   Medication Sig Start Date End Date Taking? Authorizing Provider  amLODipine (NORVASC) 5 MG tablet Take 1 tablet (5 mg total) by mouth daily. 01/31/23  Yes Mickie Bail, NP  hydrochlorothiazide (HYDRODIURIL) 25 MG tablet Take 1 tablet (25 mg total) by mouth daily. 01/15/22 02/14/22  Tresa Moore, MD  omeprazole (PRILOSEC) 20 MG capsule Take 1 capsule (20 mg total) by mouth daily before breakfast. 01/03/18 03/04/19  Karamalegos, Netta Neat, DO  ondansetron (ZOFRAN) 4 MG tablet Take 1 tablet (4 mg total) by mouth every 6 (six) hours as needed for nausea or  vomiting. 01/14/22   Tresa Moore, MD    Family History Family History  Problem Relation Age of Onset   Sickle cell trait Son    Sickle cell anemia Son    Hypertension Mother    Hyperlipidemia Mother    Heart disease Father    Heart attack Father    Prostate cancer Paternal Grandfather 44       dx age 43   Prostate cancer Paternal Uncle    Diabetes Maternal Grandmother    Brain cancer Other        Glioblastoma Multiforme   Bladder Cancer Neg Hx    Kidney cancer Neg Hx     Social History Social History   Tobacco Use   Smoking status: Former    Current packs/day: 0.00    Types: Cigarettes    Quit date: 03/04/1999    Years since quitting: 23.9   Smokeless tobacco: Never  Vaping Use   Vaping status: Never Used  Substance Use Topics   Alcohol use: No   Drug use: Never     Allergies   Penicillins   Review of Systems Review of Systems  Constitutional:  Negative for chills and fever.  Eyes:  Negative for visual disturbance.  Respiratory:  Negative for cough and shortness of breath.   Cardiovascular:  Negative for chest pain and palpitations.  Neurological:  Positive for headaches. Negative  for dizziness, syncope, facial asymmetry, speech difficulty, weakness, light-headedness and numbness.     Physical Exam Triage Vital Signs ED Triage Vitals [01/31/23 1521]  Encounter Vitals Group     BP (!) 155/102     Systolic BP Percentile      Diastolic BP Percentile      Pulse Rate 80     Resp 18     Temp 98.4 F (36.9 C)     Temp src      SpO2 97 %     Weight      Height      Head Circumference      Peak Flow      Pain Score      Pain Loc      Pain Education      Exclude from Growth Chart    No data found.  Updated Vital Signs BP (!) 155/102   Pulse 80   Temp 98.4 F (36.9 C)   Resp 18   SpO2 97%   Visual Acuity Right Eye Distance:   Left Eye Distance:   Bilateral Distance:    Right Eye Near:   Left Eye Near:    Bilateral Near:      Physical Exam Vitals and nursing note reviewed.  Constitutional:      General: He is not in acute distress.    Appearance: He is well-developed.  HENT:     Mouth/Throat:     Mouth: Mucous membranes are moist.  Eyes:     Pupils: Pupils are equal, round, and reactive to light.  Cardiovascular:     Rate and Rhythm: Normal rate and regular rhythm.     Heart sounds: Normal heart sounds.  Pulmonary:     Effort: Pulmonary effort is normal. No respiratory distress.     Breath sounds: Normal breath sounds.  Musculoskeletal:     Cervical back: Neck supple.  Skin:    General: Skin is warm and dry.  Neurological:     General: No focal deficit present.     Mental Status: He is alert and oriented to person, place, and time.     Cranial Nerves: No cranial nerve deficit.     Sensory: No sensory deficit.     Motor: No weakness.     Gait: Gait normal.  Psychiatric:        Mood and Affect: Mood normal.        Behavior: Behavior normal.      UC Treatments / Results  Labs (all labs ordered are listed, but only abnormal results are displayed) Labs Reviewed - No data to display  EKG   Radiology No results found.  Procedures Procedures (including critical care time)  Medications Ordered in UC Medications - No data to display  Initial Impression / Assessment and Plan / UC Course  I have reviewed the triage vital signs and the nursing notes.  Pertinent labs & imaging results that were available during my care of the patient were reviewed by me and considered in my medical decision making (see chart for details).    Headache, elevated blood pressure reading with hypertension.  Patient reports history of hypertension but is not taking medication because he does not have a PCP to provide prescription for the medications.  Amlodipine 5 mg daily restarted today with 1 prescription refill provided.  Patient scheduled at [facility] to establish PCP on 03/16/2023.  Patient reports  he is under a lot of stress at work and request  3 days off.  Work note provided.  Education provided on headache and managing hypertension.  Instructed patient to follow-up with his new PCP as scheduled.  ED precautions given.  He agrees to plan of care.  Final Clinical Impressions(s) / UC Diagnoses   Final diagnoses:  Acute nonintractable headache, unspecified headache type  Elevated blood pressure reading in office with diagnosis of hypertension     Discharge Instructions      Take the amlodipine as directed.  Your blood pressure is elevated today at 155/102.  Please have this rechecked by your primary care provider.      Go to the emergency department if your symptoms persist or worsen.      ED Prescriptions     Medication Sig Dispense Auth. Provider   amLODipine (NORVASC) 5 MG tablet Take 1 tablet (5 mg total) by mouth daily. 30 tablet Mickie Bail, NP      PDMP not reviewed this encounter.   Mickie Bail, NP 01/31/23 1556

## 2023-01-31 NOTE — Discharge Instructions (Addendum)
Take the amlodipine as directed.  Your blood pressure is elevated today at 155/102.  Please have this rechecked by your primary care provider.      Go to the emergency department if your symptoms persist or worsen.

## 2023-03-16 ENCOUNTER — Encounter: Payer: Self-pay | Admitting: Nurse Practitioner

## 2023-03-16 ENCOUNTER — Ambulatory Visit: Payer: Medicaid Other | Admitting: Nurse Practitioner

## 2023-03-16 VITALS — BP 142/92 | HR 80 | Temp 98.0°F | Ht 69.0 in | Wt 212.4 lb

## 2023-03-16 DIAGNOSIS — R1013 Epigastric pain: Secondary | ICD-10-CM | POA: Diagnosis not present

## 2023-03-16 DIAGNOSIS — R7303 Prediabetes: Secondary | ICD-10-CM | POA: Diagnosis not present

## 2023-03-16 DIAGNOSIS — I10 Essential (primary) hypertension: Secondary | ICD-10-CM | POA: Insufficient documentation

## 2023-03-16 DIAGNOSIS — Z1329 Encounter for screening for other suspected endocrine disorder: Secondary | ICD-10-CM

## 2023-03-16 DIAGNOSIS — E782 Mixed hyperlipidemia: Secondary | ICD-10-CM | POA: Diagnosis not present

## 2023-03-16 MED ORDER — PANTOPRAZOLE SODIUM 40 MG PO TBEC: 40.0000 mg | DELAYED_RELEASE_TABLET | Freq: Every day | ORAL | 3 refills | Status: AC

## 2023-03-16 MED ORDER — AMLODIPINE BESYLATE 5 MG PO TABS: 5.0000 mg | ORAL_TABLET | Freq: Every day | ORAL | 3 refills | Status: AC

## 2023-03-16 MED ORDER — HYDROCHLOROTHIAZIDE 25 MG PO TABS: 12.5000 mg | ORAL_TABLET | Freq: Every day | ORAL | 3 refills | Status: AC

## 2023-03-16 NOTE — Assessment & Plan Note (Signed)
They have a history of high cholesterol but are not currently on medication. We will order a lipid panel today. Encouraged to continue healthy diet and exercise.

## 2023-03-16 NOTE — Progress Notes (Signed)
Robert Dicker, NP-C Phone: [phone]  Robert Smith is a 47 y.o. male who presents today to establish care.   Discussed the use of AI scribe software for clinical note transcription with the patient, who gave verbal consent to proceed.  History of Present Illness   The patient, with a history of hypertension, hyperlipidemia, and gastroesophageal reflux disease (GERD), presents with concerns about persistent hypertension despite medication and recurrent symptoms of GERD. They report a history of H. Pylori infection, treated with antibiotics, but have not been retested since.   The patient has been experiencing episodes of high blood pressure, associated with intense headaches, which have led to hospital visits. They report taking Norvasc for hypertension but have discontinued hydrochlorothiazide due to side effects of dizziness, fatigue, and low energy. Despite taking Norvasc daily, their blood pressure remains elevated.  In addition to hypertension, the patient reports developing acne after the H. Pylori treatment. They also mention recurrent symptoms of GERD, including heartburn and chest discomfort, particularly after consuming certain foods. They report no difficulty swallowing, no blood in stool, and no abdominal pain.  The patient maintains an active lifestyle, including [place] Jiu[employer]itsu, and has made significant dietary changes, including avoiding lactose and reducing the intake of sweets. They report no mood, anxiety, or depression issues. Despite these lifestyle modifications, their blood pressure remains consistently high, and they continue to experience GERD symptoms.      Active Ambulatory Problems    Diagnosis Date Noted   Gastroesophageal reflux disease 10/22/2017   Sickle cell trait (HCC) 10/22/2017   Elevated hemoglobin A1c 11/08/2017   Mixed hyperlipidemia 11/08/2017   H. pylori infection 11/14/2017   Hypertensive urgency 01/13/2022   Hypertension 03/16/2023   Prediabetes  03/16/2023   Dyspepsia 03/16/2023   Resolved Ambulatory Problems    Diagnosis Date Noted   Elevated LFTs 10/22/2017   Past Medical History:  Diagnosis Date   Dengue fever     Family History  Problem Relation Age of Onset   Sickle cell trait Son    Sickle cell anemia Son    Hypertension Mother    Hyperlipidemia Mother    Heart disease Father    Heart attack Father    Prostate cancer Paternal Grandfather 70       dx age 75   Prostate cancer Paternal Uncle    Diabetes Maternal Grandmother    Brain cancer Other        Glioblastoma Multiforme   Bladder Cancer Neg Hx    Kidney cancer Neg Hx     Social History   Socioeconomic History   Marital status: Married    Spouse name: Not on file   Number of children: 2   Years of education: College   Highest education level: Associate degree: occupational, Scientist, product/process development, or vocational program  Occupational History   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  Tobacco Use   Smoking status: Former    Current packs/day: 0.00    Types: Cigarettes    Quit date: 03/04/1999    Years since quitting: 24.0   Smokeless tobacco: Never  Vaping Use   Vaping status: Never Used  Substance and Sexual Activity   Alcohol use: No   Drug use: Never   Sexual activity: Never  Other Topics Concern   Not on file  Social History Narrative   Not on file   Social Determinants of Health   Financial Resource Strain: Low Risk  (03/09/2023)   Overall Financial Resource Strain (CARDIA)    Difficulty  of Paying Living Expenses: Not very hard  Food Insecurity: Food Insecurity Present (03/09/2023)   Hunger Vital Sign    Worried About Running Out of Food in the Last Year: Never true    Ran Out of Food in the Last Year: Sometimes true  Transportation Needs: No Transportation Needs (03/09/2023)   PRAPARE - Administrator, Civil Service (Medical): No    Lack of Transportation (Non-Medical): No  Physical Activity: Insufficiently  Active (03/09/2023)   Exercise Vital Sign    Days of Exercise per Week: 2 days    Minutes of Exercise per Session: 60 min  Stress: Stress Concern Present (03/09/2023)   [employer] of Occupational Health - Occupational Stress Questionnaire    Feeling of Stress : Rather much  Social Connections: Socially Integrated (03/09/2023)   Social Connection and Isolation Panel [NHANES]    Frequency of Communication with Friends and Family: More than three times a week    Frequency of Social Gatherings with Friends and Family: Once a week    Attends Religious Services: More than 4 times per year    Active Member of [employer] or Organizations: Yes    Attends Engineer, structural: More than 4 times per year    Marital Status: Married  Catering manager Violence: Not At Risk (01/13/2022)   Humiliation, Afraid, Rape, and Kick questionnaire    Fear of Current or Ex-Partner: No    Emotionally Abused: No    Physically Abused: No    Sexually Abused: No    ROS  General:  Negative for unexplained weight loss, fever Skin: Negative for new or changing mole, sore that won't heal HEENT: Negative for trouble hearing, trouble seeing, ringing in ears, mouth sores, hoarseness, change in voice, dysphagia. CV:  Negative for chest pain, dyspnea, edema, palpitations Resp: Negative for cough, dyspnea, hemoptysis GI: Negative for nausea, vomiting, diarrhea, constipation, abdominal pain, melena, hematochezia. GU: Negative for dysuria, incontinence, urinary hesitance, hematuria, vaginal or penile discharge, polyuria, sexual difficulty, lumps in testicle or breasts MSK: Negative for muscle cramps or aches, joint pain or swelling Neuro: Negative for headaches, weakness, numbness, dizziness, passing out/fainting Psych: Negative for depression, anxiety, memory problems  Objective  Physical Exam Vitals:   03/16/23 1353 03/16/23 1425  BP: (!) 140/90 (!) 142/92  Pulse: 80   Temp: 98 F (36.7 C)   SpO2: 97%      BP Readings from Last 3 Encounters:  03/16/23 (!) 142/92  01/31/23 (!) 155/102  01/14/22 (!) 140/93   Wt Readings from Last 3 Encounters:  03/16/23 212 lb 6.4 oz (96.3 kg)  01/13/22 220 lb 0.3 oz (99.8 kg)  04/28/19 220 lb (99.8 kg)    Physical Exam Constitutional:      General: He is not in acute distress.    Appearance: Normal appearance.  HENT:     Head: Normocephalic.  Cardiovascular:     Rate and Rhythm: Normal rate and regular rhythm.     Heart sounds: Normal heart sounds.  Pulmonary:     Effort: Pulmonary effort is normal.     Breath sounds: Normal breath sounds.  Skin:    General: Skin is warm and dry.  Neurological:     General: No focal deficit present.     Mental Status: He is alert.  Psychiatric:        Mood and Affect: Mood normal.        Behavior: Behavior normal.    Assessment/Plan:   Hypertension, unspecified type  Assessment & Plan: Despite adherence to Norvasc 5 mg daily, they exhibit elevated blood pressure readings both at home and in the office. After discussing the risks and benefits, we will add Hydrochlorothiazide 12.5 mg daily. They are instructed to check their blood pressure at home and bring their cuff to the next appointment. They will return in 2 weeks for a blood pressure check and labs. Encouraged to continue healthy diet and exercise, advised decreased sodium intake.   Orders: -     CBC with Differential/Platelet -     Comprehensive metabolic panel -     Testosterone -     amLODIPine Besylate; Take 1 tablet (5 mg total) by mouth daily.  Dispense: 90 tablet; Refill: 3 -     hydroCHLOROthiazide; Take 0.5 tablets (12.5 mg total) by mouth daily.  Dispense: 45 tablet; Refill: 3  Dyspepsia Assessment & Plan: They report heartburn and reflux symptoms, previously treated with Omeprazole. We will start Protonix 40 mg daily and advise on dietary modifications to reduce symptoms. They also have a history of H. Pylori infection treated with  antibiotics but lacked follow-up testing and report acne post-treatment. We will order an H. Pylori breath test to confirm eradication.  Orders: -     H. pylori breath test -     Pantoprazole Sodium; Take 1 tablet (40 mg total) by mouth daily.  Dispense: 90 tablet; Refill: 3  Mixed hyperlipidemia Assessment & Plan: They have a history of high cholesterol but are not currently on medication. We will order a lipid panel today. Encouraged to continue healthy diet and exercise.   Orders: -     Lipid panel  Prediabetes Assessment & Plan: They have a history of elevated A1C without current medication. He has since made dietary modifications. We will order an A1C today.   Orders: -     Hemoglobin A1c  Thyroid disorder screen -     TSH    Return in about 2 weeks (around 03/30/2023) for Blood pressure check and labs with nursing then 6 month follow up.   Robert Dicker, NP-C [facility] [employer]

## 2023-03-16 NOTE — Assessment & Plan Note (Signed)
They report heartburn and reflux symptoms, previously treated with Omeprazole. We will start Protonix 40 mg daily and advise on dietary modifications to reduce symptoms. They also have a history of H. Pylori infection treated with antibiotics but lacked follow-up testing and report acne post-treatment. We will order an H. Pylori breath test to confirm eradication.

## 2023-03-16 NOTE — Assessment & Plan Note (Signed)
They have a history of elevated A1C without current medication. He has since made dietary modifications. We will order an A1C today.

## 2023-03-16 NOTE — Assessment & Plan Note (Signed)
Despite adherence to Norvasc 5 mg daily, they exhibit elevated blood pressure readings both at home and in the office. After discussing the risks and benefits, we will add Hydrochlorothiazide 12.5 mg daily. They are instructed to check their blood pressure at home and bring their cuff to the next appointment. They will return in 2 weeks for a blood pressure check and labs. Encouraged to continue healthy diet and exercise, advised decreased sodium intake.

## 2023-03-17 LAB — COMPREHENSIVE METABOLIC PANEL
ALT: 52 [IU]/L — ABNORMAL HIGH (ref 0–44)
AST: 30 [IU]/L (ref 0–40)
Albumin: 4.6 g/dL (ref 4.1–5.1)
Alkaline Phosphatase: 99 [IU]/L (ref 44–121)
BUN/Creatinine Ratio: 16 (ref 9–20)
BUN: 16 mg/dL (ref 6–24)
Bilirubin Total: 0.8 mg/dL (ref 0.0–1.2)
CO2: 20 mmol/L (ref 20–29)
Calcium: 10.1 mg/dL (ref 8.7–10.2)
Chloride: 102 mmol/L (ref 96–106)
Creatinine, Ser: 1 mg/dL (ref 0.76–1.27)
Globulin, Total: 3 g/dL (ref 1.5–4.5)
Glucose: 111 mg/dL — ABNORMAL HIGH (ref 70–99)
Potassium: 4.4 mmol/L (ref 3.5–5.2)
Sodium: 138 mmol/L (ref 134–144)
Total Protein: 7.6 g/dL (ref 6.0–8.5)
eGFR: 94 mL/min/{1.73_m2} (ref >59)

## 2023-03-17 LAB — CBC WITH DIFFERENTIAL/PLATELET
Basophils Absolute: 0.1 10*3/uL (ref 0.0–0.2)
Basos: 1 %
EOS (ABSOLUTE): 0.2 10*3/uL (ref 0.0–0.4)
Eos: 3 %
Hematocrit: 45.1 % (ref 37.5–51.0)
Hemoglobin: 15.1 g/dL (ref 13.0–17.7)
Immature Grans (Abs): 0 10*3/uL (ref 0.0–0.1)
Immature Granulocytes: 1 %
Lymphocytes Absolute: 2.5 10*3/uL (ref 0.7–3.1)
Lymphs: 43 %
MCH: 28.5 pg (ref 26.6–33.0)
MCHC: 33.5 g/dL (ref 31.5–35.7)
MCV: 85 fL (ref 79–97)
Monocytes Absolute: 0.5 10*3/uL (ref 0.1–0.9)
Monocytes: 9 %
Neutrophils Absolute: 2.6 10*3/uL (ref 1.4–7.0)
Neutrophils: 43 %
Platelets: 264 10*3/uL (ref 150–450)
RBC: 5.3 x10E6/uL (ref 4.14–5.80)
RDW: 12.8 % (ref 11.6–15.4)
WBC: 5.9 10*3/uL (ref 3.4–10.8)

## 2023-03-17 LAB — HEMOGLOBIN A1C
Est. average glucose Bld gHb Est-mCnc: 171 mg/dL
Hgb A1c MFr Bld: 7.6 % — ABNORMAL HIGH (ref 4.8–5.6)

## 2023-03-17 LAB — LIPID PANEL
Chol/HDL Ratio: 6.8 ratio — ABNORMAL HIGH (ref 0.0–5.0)
Cholesterol, Total: 219 mg/dL — ABNORMAL HIGH (ref 100–199)
HDL: 32 mg/dL — ABNORMAL LOW (ref >39)
LDL Chol Calc (NIH): 106 mg/dL — ABNORMAL HIGH (ref 0–99)
Triglycerides: 471 mg/dL — ABNORMAL HIGH (ref 0–149)
VLDL Cholesterol Cal: 81 mg/dL — ABNORMAL HIGH (ref 5–40)

## 2023-03-17 LAB — TESTOSTERONE: Testosterone: 360 ng/dL (ref 264–916)

## 2023-03-17 LAB — TSH: TSH: 0.826 u[IU]/mL (ref 0.450–4.500)

## 2023-03-19 ENCOUNTER — Telehealth: Payer: Self-pay

## 2023-03-19 ENCOUNTER — Other Ambulatory Visit: Payer: Self-pay | Admitting: Nurse Practitioner

## 2023-03-19 ENCOUNTER — Encounter: Payer: Self-pay | Admitting: Nurse Practitioner

## 2023-03-19 DIAGNOSIS — E782 Mixed hyperlipidemia: Secondary | ICD-10-CM

## 2023-03-19 DIAGNOSIS — E119 Type 2 diabetes mellitus without complications: Secondary | ICD-10-CM

## 2023-03-19 HISTORY — DX: Type 2 diabetes mellitus without complications: E11.9

## 2023-03-19 MED ORDER — ROSUVASTATIN CALCIUM 10 MG PO TABS: 10.0000 mg | ORAL_TABLET | Freq: Every day | ORAL | 3 refills | Status: AC

## 2023-03-19 MED ORDER — METFORMIN HCL ER 500 MG PO TB24: 500.0000 mg | ORAL_TABLET | Freq: Two times a day (BID) | ORAL | 3 refills | Status: AC

## 2023-03-19 NOTE — Telephone Encounter (Signed)
LMOM for pt to CB in regards to labs

## 2023-03-19 NOTE — Telephone Encounter (Signed)
Pt returning call

## 2023-03-19 NOTE — Telephone Encounter (Signed)
Called pt back and informed him of results, documented in lab tab

## 2023-03-20 ENCOUNTER — Telehealth: Payer: Self-pay

## 2023-03-20 ENCOUNTER — Other Ambulatory Visit: Payer: Medicaid Other

## 2023-03-20 NOTE — Telephone Encounter (Signed)
Called and left a vm asking pt why did he come to the office to have labs redrawn as provider has not ordered any repeat labs, provider stated he has come by the last two mornings and I spoke with pt on yesterday in regards to labs and did not mention to him to come back for labs at all.

## 2023-03-21 LAB — H. PYLORI BREATH TEST: H. pylori Breath Test: DETECTED — AB

## 2023-03-27 ENCOUNTER — Other Ambulatory Visit: Payer: Self-pay | Admitting: Nurse Practitioner

## 2023-03-27 DIAGNOSIS — A048 Other specified bacterial intestinal infections: Secondary | ICD-10-CM

## 2023-03-30 ENCOUNTER — Ambulatory Visit: Payer: Medicaid Other

## 2023-03-30 VITALS — BP 130/88 | HR 79

## 2023-03-30 DIAGNOSIS — I10 Essential (primary) hypertension: Secondary | ICD-10-CM

## 2023-03-30 NOTE — Progress Notes (Signed)
Patient here for nurse visit BP check per order from 03/16/2023.   Patient reports compliance with prescribed BP medications: yes  Last dose of BP medication:   BP Readings from Last 3 Encounters:  03/30/23 130/88  03/16/23 (!) 142/92  01/31/23 (!) 155/102   Pulse Readings from Last 3 Encounters:  03/30/23 79  03/16/23 80  01/31/23 80    Per [name] NP: Jobie Quaker plan here]  Patient verbalized understanding of instructions.   Rockney Grenz  [place], CMA

## 2023-04-11 LAB — HM DIABETES EYE EXAM

## 2023-05-15 NOTE — Progress Notes (Unsigned)
Robert Amy, PA-C [address]  [address]  [place], [place] [address]  Main: [phone]  Fax: [phone]   Gastroenterology Consultation  Referring Provider:     [name], NP Primary Care Physician:  Robert Dicker, NP Primary Gastroenterologist:  Robert Amy, PA-C / Dr. Wyline Smith   Reason for Consultation:     H. Pylori, upper abdominal pain, GERD        HPI:   Robert Smith is a 48 y.o. y/o male referred for consultation & management  by Robert Dicker, NP.    Patient saw his PCP 02/2023 for port for persistent GERD symptoms.  Had H. pylori breath test which was positive 03/16/2023 (not yet treated).  Patient initially tested positive for H. pylori 10/2017 treated with triple therapy (clarithromycin, metronidazole, omeprazole), and 12/2017 treated with triple therapy (Levaquin, metronidazole, omeprazole).  He is allergic to penicillin.  Reports having hives, rash, shortness of breath after taking penicillin at age 16.  He reports taking penicillin in 2018 and did not have a reaction.  Currently on pantoprazole 40 Mg once daily for GERD.  Patient reports intermittent episodes of epigastric and right upper quadrant pain since 2019.  Also has episodes of acid coming up into his chest and throat for the past 5 years.  Acid reflux is worse at nighttime.  He is trying to avoid coffee, orange juice, and GERD trigger foods.  He reports upper abdominal pain after eating meals.  Denies nausea or vomiting or dysphagia.  Denies weight loss.  Admits to weight gain.  Has mild occasional constipation and rare episode of bright red blood on the tissue attributed to hemorrhoids.  No previous EGD or colonoscopy.  He is due for first screening colonoscopy due to age.  No family history of colon, stomach, or GI malignancies.  03/16/2023 labs: Mildly elevated ALT 52, all other LFTs normal.  CBC normal.  Hemoglobin 15.1.  Triglycerides very elevated 471.  Normal TSH.  A1c 7.6.  H. pylori breath test  positive.  Of note, liver transaminases were also elevated in 2019.  He has not had liver ultrasound.  Still has gallbladder.  Past Medical History:  Diagnosis Date   Dengue fever    Hypertension 10.25.2024   It started with a headache   Type 2 diabetes mellitus without complication, without long-term current use of insulin (HCC) 03/19/2023    Past Surgical History:  Procedure Laterality Date   IMPACTED THIRD MOLAR REMOVAL     VASECTOMY  08/17/2017   [facility] Dr Lonna Cobb    Prior to Admission medications   Medication Sig Start Date End Date Taking? Authorizing Provider  amLODipine (NORVASC) 5 MG tablet Take 1 tablet (5 mg total) by mouth daily. 03/16/23   Robert Dicker, NP  hydrochlorothiazide (HYDRODIURIL) 25 MG tablet Take 0.5 tablets (12.5 mg total) by mouth daily. 03/16/23 04/15/23  Robert Dicker, NP  metFORMIN (GLUCOPHAGE-XR) 500 MG 24 hr tablet Take 1 tablet (500 mg total) by mouth 2 (two) times daily with a meal. 03/19/23   Robert Dicker, NP  pantoprazole (PROTONIX) 40 MG tablet Take 1 tablet (40 mg total) by mouth daily. 03/16/23   Robert Dicker, NP  rosuvastatin (CRESTOR) 10 MG tablet Take 1 tablet (10 mg total) by mouth daily. 03/19/23   Robert Dicker, NP    Family History  Problem Relation Age of Onset   Sickle cell trait Son    Sickle cell anemia Son    Hypertension Mother    Hyperlipidemia  Mother    Heart disease Father    Heart attack Father    Prostate cancer Paternal Grandfather 28       dx age 31   Prostate cancer Paternal Uncle    Diabetes Maternal Grandmother    Brain cancer Other        Glioblastoma Multiforme   Bladder Cancer Neg Hx    Kidney cancer Neg Hx      Social History   Tobacco Use   Smoking status: Former    Current packs/day: 0.00    Types: Cigarettes    Quit date: 03/04/1999    Years since quitting: 24.2   Smokeless tobacco: Never  Vaping Use   Vaping status: Never Used  Substance Use Topics   Alcohol use: No   Drug use: Never     Allergies as of 05/16/2023 - Review Complete 05/16/2023  Allergen Reaction Noted   Penicillins  05/22/2016    Review of Systems:    All systems reviewed and negative except where noted in HPI.   Physical Exam:  BP (!) 150/90   Pulse 61   Temp 97.7 F (36.5 C)   Ht 5\' 9"  (1.753 m)   Wt 206 lb 6.4 oz (93.6 kg)   BMI 30.48 kg/m  No LMP for male patient.  General:   Alert,  Well-developed, well-nourished, pleasant and cooperative in NAD Lungs:  Respirations even and unlabored.  Clear throughout to auscultation.   No wheezes, crackles, or rhonchi. No acute distress. Heart:  Regular rate and rhythm; no murmurs, clicks, rubs, or gallops. Abdomen:  Normal bowel sounds.  No bruits.  Soft, and non-distended without masses, hepatosplenomegaly or hernias noted.  Moderate epigastric and right upper quadrant tenderness.  No lower abdominal tenderness.  No guarding or rebound tenderness.    Neurologic:  Alert and oriented x3;  grossly normal neurologically. Psych:  Alert and cooperative. Normal Smith and affect.  Imaging Studies: No results found.  Assessment and Plan:   Robert Smith is a 48 y.o. y/o male has been referred for:  1.  H. pylori infection; Penicillin allergic; Failed Clarithromycin triple therapy.  Treat with Rx Quadruple Therapy for 14 days: Tetracycline 500 mg 4 times daily Metronidazole 250 Mg 4 times daily Bismuth subsalicylate 524mg  4 times daily Omeprazole 20mg  2 times daily.  2.  Epigastric pain  Scheduling EGD I discussed risks of EGD with patient to include risk of bleeding, perforation, and risk of sedation.  Patient expressed understanding and agrees to proceed with EGD.   3.  RUQ pain  RUQ Ultrasound - Evaluate for Gallstones and fatty liver.  4.  GERD  Recommend Lifestyle Modifications to prevent Acid Reflux.  Rec. Avoid coffee, sodas, peppermint, garlic, onions, alcohol, citrus fruits, chocolate, tomatoes, fatty and spicey foods.  Avoid eating 2-3 hours  before bedtime.    Hold pantoprazole while he is taking omeprazole.  After finishing omeprazole, then he can restart pantoprazole.  5.  Colon cancer screening  Scheduling Colonoscopy I discussed risks of colonoscopy with patient to include risk of bleeding, colon perforation, and risk of sedation.  Patient expressed understanding and agrees to proceed with colonoscopy.   Follow up 4 weeks after EGD and colonoscopy.  Robert Amy, PA-C

## 2023-05-16 ENCOUNTER — Encounter: Payer: Self-pay | Admitting: Physician Assistant

## 2023-05-16 ENCOUNTER — Ambulatory Visit (INDEPENDENT_AMBULATORY_CARE_PROVIDER_SITE_OTHER): Payer: Medicaid Other | Admitting: Physician Assistant

## 2023-05-16 VITALS — BP 150/90 | HR 61 | Temp 97.7°F | Ht 69.0 in | Wt 206.4 lb

## 2023-05-16 DIAGNOSIS — A048 Other specified bacterial intestinal infections: Secondary | ICD-10-CM

## 2023-05-16 DIAGNOSIS — R1013 Epigastric pain: Secondary | ICD-10-CM | POA: Diagnosis not present

## 2023-05-16 DIAGNOSIS — Z1211 Encounter for screening for malignant neoplasm of colon: Secondary | ICD-10-CM

## 2023-05-16 DIAGNOSIS — K219 Gastro-esophageal reflux disease without esophagitis: Secondary | ICD-10-CM

## 2023-05-16 DIAGNOSIS — R1011 Right upper quadrant pain: Secondary | ICD-10-CM | POA: Diagnosis not present

## 2023-05-16 MED ORDER — METRONIDAZOLE 250 MG PO TABS: 250.0000 mg | ORAL_TABLET | Freq: Four times a day (QID) | ORAL | 0 refills | Status: AC

## 2023-05-16 MED ORDER — BISMUTH SUBSALICYLATE 525 MG PO TABS: 1.0000 | ORAL_TABLET | Freq: Four times a day (QID) | ORAL | 0 refills | Status: AC

## 2023-05-16 MED ORDER — PEG 3350-KCL-NA BICARB-NACL 420 G PO SOLR: 4000.0000 mL | Freq: Once | ORAL | 0 refills | Status: AC

## 2023-05-16 MED ORDER — TETRACYCLINE HCL 500 MG PO CAPS: 500.0000 mg | ORAL_CAPSULE | Freq: Four times a day (QID) | ORAL | 0 refills | Status: AC

## 2023-05-16 MED ORDER — OMEPRAZOLE 20 MG PO CPDR: 20.0000 mg | DELAYED_RELEASE_CAPSULE | Freq: Two times a day (BID) | ORAL | 0 refills | Status: AC

## 2023-05-16 NOTE — Patient Instructions (Addendum)
Ultrasound scheduled 05/22/23 @ [facility] entrance- arrive at 8:15 am. Nothing to eat/drink after midnight.  For treatment of H. pylori infection take the following medications every day for 14 days:  1.  Bismuth subsalicylate 1 tablet 4 times daily. 2.  Omeprazole 20 Mg 1 tablet twice daily. 3.  Metronidazole 1 tablet 4 times daily. 4.  Tetracycline 1 tablet 4 times daily.  Stop pantoprazole while you are on omeprazole for 14 days.

## 2023-05-23 ENCOUNTER — Ambulatory Visit
Admission: RE | Admit: 2023-05-23 | Discharge: 2023-05-23 | Disposition: A | Discharge status: Home / Self Care | Payer: Medicaid Other | Source: Ambulatory Visit | Attending: Physician Assistant | Admitting: Physician Assistant

## 2023-05-23 ENCOUNTER — Encounter: Payer: Self-pay | Admitting: Physician Assistant

## 2023-05-23 DIAGNOSIS — R1013 Epigastric pain: Secondary | ICD-10-CM | POA: Diagnosis present

## 2023-05-23 DIAGNOSIS — R1011 Right upper quadrant pain: Secondary | ICD-10-CM | POA: Diagnosis present

## 2023-05-28 ENCOUNTER — Telehealth: Payer: Self-pay

## 2023-05-28 NOTE — Telephone Encounter (Signed)
Call and notify patient of results of Ultarsound- left message for patient to call office.

## 2023-06-04 ENCOUNTER — Telehealth: Payer: Self-pay

## 2023-06-04 NOTE — Telephone Encounter (Signed)
 Patient rescheduled colonoscopy and EGD 08/03/23. Spoke with endoscopy unit. Mailed instructions today.

## 2023-06-04 NOTE — Telephone Encounter (Signed)
 Patient is calling because he states his work schedule change and he is needing to reschedule his EGD

## 2023-06-12 ENCOUNTER — Encounter: Payer: Self-pay | Admitting: Nurse Practitioner

## 2023-06-21 ENCOUNTER — Telehealth: Payer: Self-pay

## 2023-06-21 NOTE — Telephone Encounter (Signed)
 Patient's EGD has been rescheduled from 04/11 to 08/02/23 with Dr. Tobi Bastos due to Dr. Tobi Bastos will be on vacation on 04/11.  Instructions updated. Referral updated. Endo notified.  Thanks, [place], [place]

## 2023-07-11 NOTE — Progress Notes (Deleted)
 Robert Canard, PA-C [address]  [address]  [place], [place] [address]  Main: [phone]  Fax: [phone]   Primary Care Physician: Robert Burkitt, NP  Primary Gastroenterologist:  ***  CC: Follow-up recurrent H. pylori, elevated LFT, GERD, upper abdominal pain.  HPI: Robert Smith is a 48 y.o. male returns for 31-month follow-up of recurrent H. pylori infection.  2 months ago he was started on quadruple therapy (tetracycline , metronidazole , bismuth  subsalicylate, omeprazole ) for 14 days.   Patient initially tested positive for H. pylori 10/2017 treated with triple therapy (clarithromycin , metronidazole , omeprazole ), and 12/2017 treated with triple therapy (Levaquin , metronidazole , omeprazole ).  He is allergic to penicillin.  Reports having hives, rash, shortness of breath after taking penicillin at age 37.  He reports taking penicillin in 2018 and did not have a reaction.  No previous EGD or colonoscopy.  No family history of colon, stomach, or GI malignancies.  He was scheduled for EGD and colonoscopy 05/2023, however had to reschedule procedures until 08/02/2023.   03/16/2023 labs: Mildly elevated ALT 52, all other LFTs normal.  CBC normal.  Hemoglobin 15.1.  Triglycerides very elevated 471.  Normal TSH.  A1c 7.6.  H. pylori breath test positive.  Of note, liver transaminases were also elevated in 2019.    05/23/2023 RUQ ultrasound: Hepatic steatosis.  Otherwise normal.  No gallstones.    Currently taking pantoprazole  40 Mg once daily.  Current symptoms.  Current Outpatient Medications  Medication Sig Dispense Refill   amLODipine  (NORVASC ) 5 MG tablet Take 1 tablet (5 mg total) by mouth daily. 90 tablet 3   hydrochlorothiazide  (HYDRODIURIL ) 25 MG tablet Take 0.5 tablets (12.5 mg total) by mouth daily. 45 tablet 3   metFORMIN  (GLUCOPHAGE -XR) 500 MG 24 hr tablet Take 1 tablet (500 mg total) by mouth 2 (two) times daily with a meal. 180 tablet 3   omeprazole  (PRILOSEC) 20 MG capsule  Take 1 capsule (20 mg total) by mouth 2 (two) times daily for 14 days. 28 capsule 0   pantoprazole  (PROTONIX ) 40 MG tablet Take 1 tablet (40 mg total) by mouth daily. 90 tablet 3   rosuvastatin  (CRESTOR ) 10 MG tablet Take 1 tablet (10 mg total) by mouth daily. 90 tablet 3   No current facility-administered medications for this visit.    Allergies as of 07/13/2023 - Review Complete 05/16/2023  Allergen Reaction Noted   Penicillins Swelling 05/22/2016    Past Medical History:  Diagnosis Date   Dengue fever    Hypertension 10.25.2024   It started with a headache   Type 2 diabetes mellitus without complication, without long-term current use of insulin (HCC) 03/19/2023    Past Surgical History:  Procedure Laterality Date   IMPACTED THIRD MOLAR REMOVAL     VASECTOMY  08/17/2017   [facility] Dr Robert Smith    Review of Systems:    All systems reviewed and negative except where noted in HPI.   Physical Examination:   There were no vitals taken for this visit.  General: Well-nourished, well-developed in no acute distress.  Lungs: Clear to auscultation bilaterally. Non-labored. Heart: Regular rate and rhythm, no murmurs rubs or gallops.  Abdomen: Bowel sounds are normal; Abdomen is Soft; No hepatosplenomegaly, masses or hernias;  No Abdominal Tenderness; No guarding or rebound tenderness. Neuro: Alert and oriented x 3.  Grossly intact.  Psych: Alert and cooperative, normal mood and affect.   Imaging Studies: No results found.  Assessment and Plan:   Robert Smith is a 48 y.o.  y/o male returns for f/u of:  1.  H. pylori infection; Penicillin allergic; Failed Clarithromycin  triple therapy.  In January 2025 he was treated with with Rx Quadruple Therapy (tetracycline , metronidazole , bismuth  subsalicylate, omeprazole ) for 14 days 04/2023. Proceed with EGD scheduled 08/02/23. Hold PPI 2 weeks before EGD and plan to check biopsies for H. Pylori.   2.  GERD             Recommend Lifestyle  Modifications to prevent Acid Reflux.  Rec. Avoid coffee, sodas, peppermint, garlic, onions, alcohol, citrus fruits, chocolate, tomatoes, fatty and spicey foods.  Avoid eating 2-3 hours before bedtime.               Hold pantoprazole  2 weeks before EGD.  He can restart pantoprazole  after EGD if needed.  3.  Hepatic Steatosis / Elevated LFT  Lab: hepatic panel, NASH FibroSure  Recommend a low-fat diet, regular exercise, and weight loss. Patient education handout about fatty liver disease was given and discussed from up-to-date.    4.  Colon cancer screening             Proceed with colonoscopy on 08/02/23 as scheduled. I discussed risks of colonoscopy with patient to include risk of bleeding, colon perforation, and risk of sedation.  Patient expressed understanding and agrees to proceed with colonoscopy.     Robert Canard, PA-C  Follow up as needed based on EGD, colonoscopy results and GI symptoms.  BP check ***

## 2023-07-12 ENCOUNTER — Telehealth: Payer: Self-pay | Admitting: Physician Assistant

## 2023-07-13 ENCOUNTER — Ambulatory Visit: Payer: Medicaid Other | Admitting: Physician Assistant

## 2023-07-19 NOTE — Telephone Encounter (Signed)
error 

## 2023-08-02 ENCOUNTER — Encounter: Admission: RE | Payer: Self-pay | Source: Home / Self Care

## 2023-08-02 ENCOUNTER — Ambulatory Visit: Admission: RE | Admit: 2023-08-02 | Payer: Medicaid Other | Source: Home / Self Care | Admitting: Gastroenterology

## 2023-08-02 SURGERY — ESOPHAGOGASTRODUODENOSCOPY (EGD) WITH PROPOFOL
Anesthesia: General

## 2023-09-18 ENCOUNTER — Ambulatory Visit: Payer: Medicaid Other | Admitting: Nurse Practitioner
